# Patient Record
Sex: Male | Born: 1960 | Hispanic: Yes | Marital: Married | Smoking: Never smoker
Health system: Southern US, Community
[De-identification: ages and names within clinical notes are randomized; demographics above are authoritative.]

## PROBLEM LIST (undated history)

## (undated) DIAGNOSIS — I639 Cerebral infarction, unspecified: Secondary | ICD-10-CM

## (undated) DIAGNOSIS — E7800 Pure hypercholesterolemia, unspecified: Secondary | ICD-10-CM

## (undated) HISTORY — DX: Cerebral infarction, unspecified: I63.9

---

## 2009-09-15 ENCOUNTER — Emergency Department (HOSPITAL_COMMUNITY)
Admission: EM | Admit: 2009-09-15 | Discharge: 2009-09-15 | Payer: Self-pay | Source: Home / Self Care | Admitting: Emergency Medicine

## 2010-06-07 LAB — POCT I-STAT, CHEM 8
BUN: 17 mg/dL (ref 6–23)
Calcium, Ion: 1.13 mmol/L (ref 1.12–1.32)
Creatinine, Ser: 1.3 mg/dL (ref 0.4–1.5)
Potassium: 3.4 mEq/L — ABNORMAL LOW (ref 3.5–5.1)
Sodium: 139 mEq/L (ref 135–145)

## 2010-06-07 LAB — CBC
HCT: 40.2 % (ref 39.0–52.0)
MCH: 29.3 pg (ref 26.0–34.0)
MCV: 84.8 fL (ref 78.0–100.0)
Platelets: 352 10*3/uL (ref 150–400)
RBC: 4.74 MIL/uL (ref 4.22–5.81)

## 2010-06-07 LAB — DIFFERENTIAL
Basophils Relative: 0 % (ref 0–1)
Eosinophils Absolute: 0.2 10*3/uL (ref 0.0–0.7)
Eosinophils Relative: 2 % (ref 0–5)
Lymphocytes Relative: 20 % (ref 12–46)
Lymphs Abs: 1.7 10*3/uL (ref 0.7–4.0)
Neutro Abs: 5.6 10*3/uL (ref 1.7–7.7)

## 2016-05-01 ENCOUNTER — Encounter (HOSPITAL_COMMUNITY): Payer: Self-pay | Admitting: Nurse Practitioner

## 2016-05-01 ENCOUNTER — Emergency Department (HOSPITAL_COMMUNITY): Payer: Self-pay

## 2016-05-01 ENCOUNTER — Inpatient Hospital Stay (HOSPITAL_COMMUNITY)
Admission: EM | Admit: 2016-05-01 | Discharge: 2016-05-03 | DRG: 065 | Disposition: A | Discharge status: Home / Self Care | Payer: Self-pay | Attending: Family Medicine | Admitting: Family Medicine

## 2016-05-01 DIAGNOSIS — Z789 Other specified health status: Secondary | ICD-10-CM

## 2016-05-01 DIAGNOSIS — R4781 Slurred speech: Secondary | ICD-10-CM | POA: Diagnosis present

## 2016-05-01 DIAGNOSIS — I6350 Cerebral infarction due to unspecified occlusion or stenosis of unspecified cerebral artery: Secondary | ICD-10-CM

## 2016-05-01 DIAGNOSIS — I6302 Cerebral infarction due to thrombosis of basilar artery: Secondary | ICD-10-CM

## 2016-05-01 DIAGNOSIS — Z603 Acculturation difficulty: Secondary | ICD-10-CM

## 2016-05-01 DIAGNOSIS — R29810 Facial weakness: Secondary | ICD-10-CM | POA: Diagnosis present

## 2016-05-01 DIAGNOSIS — R739 Hyperglycemia, unspecified: Secondary | ICD-10-CM

## 2016-05-01 DIAGNOSIS — E785 Hyperlipidemia, unspecified: Secondary | ICD-10-CM | POA: Diagnosis present

## 2016-05-01 DIAGNOSIS — E781 Pure hyperglyceridemia: Secondary | ICD-10-CM | POA: Diagnosis present

## 2016-05-01 DIAGNOSIS — R2990 Unspecified symptoms and signs involving the nervous system: Secondary | ICD-10-CM

## 2016-05-01 DIAGNOSIS — R29702 NIHSS score 2: Secondary | ICD-10-CM | POA: Diagnosis present

## 2016-05-01 DIAGNOSIS — R531 Weakness: Secondary | ICD-10-CM

## 2016-05-01 DIAGNOSIS — I639 Cerebral infarction, unspecified: Principal | ICD-10-CM | POA: Diagnosis present

## 2016-05-01 DIAGNOSIS — R1310 Dysphagia, unspecified: Secondary | ICD-10-CM | POA: Diagnosis present

## 2016-05-01 DIAGNOSIS — G8191 Hemiplegia, unspecified affecting right dominant side: Secondary | ICD-10-CM | POA: Diagnosis present

## 2016-05-01 LAB — I-STAT CHEM 8, ED
BUN: 18 mg/dL (ref 6–20)
CALCIUM ION: 1.2 mmol/L (ref 1.15–1.40)
CHLORIDE: 106 mmol/L (ref 101–111)
Creatinine, Ser: 0.8 mg/dL (ref 0.61–1.24)
GLUCOSE: 105 mg/dL — AB (ref 65–99)
HCT: 43 % (ref 39.0–52.0)
Hemoglobin: 14.6 g/dL (ref 13.0–17.0)
Potassium: 3.5 mmol/L (ref 3.5–5.1)
Sodium: 141 mmol/L (ref 135–145)
TCO2: 25 mmol/L (ref 0–100)

## 2016-05-01 LAB — COMPREHENSIVE METABOLIC PANEL
ALBUMIN: 3.9 g/dL (ref 3.5–5.0)
ALK PHOS: 75 U/L (ref 38–126)
ALT: 19 U/L (ref 17–63)
AST: 23 U/L (ref 15–41)
Anion gap: 10 (ref 5–15)
BILIRUBIN TOTAL: 0.4 mg/dL (ref 0.3–1.2)
BUN: 15 mg/dL (ref 6–20)
CALCIUM: 9.6 mg/dL (ref 8.9–10.3)
CO2: 23 mmol/L (ref 22–32)
Chloride: 106 mmol/L (ref 101–111)
Creatinine, Ser: 0.9 mg/dL (ref 0.61–1.24)
GFR calc Af Amer: >60 mL/min (ref >60)
GFR calc non Af Amer: >60 mL/min (ref >60)
GLUCOSE: 106 mg/dL — AB (ref 65–99)
Potassium: 3.6 mmol/L (ref 3.5–5.1)
Sodium: 139 mmol/L (ref 135–145)
TOTAL PROTEIN: 6.5 g/dL (ref 6.5–8.1)

## 2016-05-01 LAB — CBC
HEMATOCRIT: 43.5 % (ref 39.0–52.0)
HEMOGLOBIN: 14.7 g/dL (ref 13.0–17.0)
MCH: 28.8 pg (ref 26.0–34.0)
MCHC: 33.8 g/dL (ref 30.0–36.0)
MCV: 85.3 fL (ref 78.0–100.0)
Platelets: 309 10*3/uL (ref 150–400)
RBC: 5.1 MIL/uL (ref 4.22–5.81)
RDW: 12.5 % (ref 11.5–15.5)
WBC: 7.5 10*3/uL (ref 4.0–10.5)

## 2016-05-01 LAB — DIFFERENTIAL
BASOS ABS: 0 10*3/uL (ref 0.0–0.1)
Basophils Relative: 0 %
EOS PCT: 3 %
Eosinophils Absolute: 0.2 10*3/uL (ref 0.0–0.7)
LYMPHS ABS: 1.9 10*3/uL (ref 0.7–4.0)
LYMPHS PCT: 25 %
Monocytes Absolute: 0.6 10*3/uL (ref 0.1–1.0)
Monocytes Relative: 8 %
NEUTROS PCT: 64 %
Neutro Abs: 4.8 10*3/uL (ref 1.7–7.7)

## 2016-05-01 LAB — APTT: aPTT: 32 seconds (ref 24–36)

## 2016-05-01 LAB — I-STAT TROPONIN, ED: Troponin i, poc: 0 ng/mL (ref 0.00–0.08)

## 2016-05-01 LAB — CBG MONITORING, ED: Glucose-Capillary: 119 mg/dL — ABNORMAL HIGH (ref 65–99)

## 2016-05-01 LAB — PROTIME-INR
INR: 0.98
Prothrombin Time: 12.9 seconds (ref 11.4–15.2)

## 2016-05-01 MED ORDER — ACETAMINOPHEN 325 MG PO TABS: 650.0000 mg | ORAL_TABLET | ORAL | Status: DC | PRN

## 2016-05-01 MED ORDER — SENNOSIDES-DOCUSATE SODIUM 8.6-50 MG PO TABS: 1.0000 | ORAL_TABLET | Freq: Every evening | ORAL | Status: DC | PRN

## 2016-05-01 MED ORDER — ACETAMINOPHEN 160 MG/5ML PO SOLN: 650.0000 mg | ORAL | Status: DC | PRN

## 2016-05-01 MED ORDER — STROKE: EARLY STAGES OF RECOVERY BOOK
Freq: Once | Status: AC
  Administered 2016-05-01: 23:00:00
  Filled 2016-05-01: qty 1

## 2016-05-01 MED ORDER — ACETAMINOPHEN 650 MG RE SUPP: 650.0000 mg | RECTAL | Status: DC | PRN

## 2016-05-01 MED ORDER — SODIUM CHLORIDE 0.9 % IV SOLN
INTRAVENOUS | Status: DC
  Administered 2016-05-01 – 2016-05-02 (×2): via INTRAVENOUS

## 2016-05-01 MED ORDER — ENOXAPARIN SODIUM 40 MG/0.4ML ~~LOC~~ SOLN
40.0000 mg | SUBCUTANEOUS | Status: DC
  Administered 2016-05-01 – 2016-05-02 (×2): 40 mg via SUBCUTANEOUS
  Filled 2016-05-01 (×2): qty 0.4

## 2016-05-01 NOTE — ED Triage Notes (Addendum)
The patient presents with c/o stroke symptoms. The symptoms began 2 days ago. He reports dizziness, slurred speech, R facial droop, numbness to the R side of his body. He denies LOC, confusion, pain. The symptoms have been getting worse since onset. He denies any history fo stroke.

## 2016-05-01 NOTE — ED Provider Notes (Signed)
[facility]-EMERGENCY DEPT Provider Note   CSN: [id number] Arrival date & time: 05/01/16  1616     History   Chief Complaint Chief Complaint  Patient presents with  . Stroke Symptoms    HPI Steve Callahan is a 56 y.o. male.  Patient is a 56 year old Hispanic male with no significant past medical history who presents with strokelike symptoms. The symptoms started yesterday afternoon. It's been persistent since that time. He reports slurred speech in association with some facial drooping and right side numbness and weakness involving both his upper and lower extremity. He also has some dizziness on ambulation and feels off balance. He denies any chest pain or shortness of breath. No other recent illnesses. No prior history of strokes.      History reviewed. No pertinent past medical history.  Patient Active Problem List   Diagnosis Date Noted  . 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 Stroke [facility]) 05/01/2016    History reviewed. No pertinent surgical history.     Home Medications    Prior to Admission medications   Not on File    Family History History reviewed. No pertinent family history.  Social History Social History  Substance Use Topics  . Smoking status: Never Smoker  . Smokeless tobacco: Never Used  . Alcohol use No     Allergies   Patient has no known allergies.   Review of Systems Review of Systems  Constitutional: Negative for chills, diaphoresis, fatigue and fever.  HENT: Negative for congestion, rhinorrhea and sneezing.   Eyes: Negative.   Respiratory: Negative for cough, chest tightness and shortness of breath.   Cardiovascular: Negative for chest pain and leg swelling.  Gastrointestinal: Negative for abdominal pain, blood in stool, diarrhea, nausea and vomiting.  Genitourinary: Negative for difficulty urinating, flank pain, frequency and hematuria.  Musculoskeletal: Negative for arthralgias and back pain.  Skin: Negative for rash.  Neurological:  Positive for dizziness, speech difficulty, weakness, numbness and headaches.     Physical Exam Updated Vital Signs BP 126/77 (BP Location: Right Arm)   Pulse (!) 58   Temp 97.7 F (36.5 C) (Oral)   Resp 16   Ht 5\' 6"  (1.676 m)   Wt 178 lb 2.1 oz (80.8 kg)   SpO2 100%   BMI 28.75 kg/m   Physical Exam  Constitutional: He is oriented to person, place, and time. He appears well-developed and well-nourished.  HENT:  Head: Normocephalic and atraumatic.  Eyes: Pupils are equal, round, and reactive to light.  Neck: Normal range of motion. Neck supple.  Cardiovascular: Normal rate, regular rhythm and normal heart sounds.   Pulmonary/Chest: Effort normal and breath sounds normal. No respiratory distress. He has no wheezes. He has no rales. He exhibits no tenderness.  Abdominal: Soft. Bowel sounds are normal. There is no tenderness. There is no rebound and no guarding.  Musculoskeletal: Normal range of motion. He exhibits no edema.  Lymphadenopathy:    He has no cervical adenopathy.  Neurological: He is alert and oriented to person, place, and time.  Patient has some slight facial drooping. He has a slight right arm drift. No obvious drift in the right leg. He has some diminished sensation to light touch in the upper and lower extremity on the right as well as the right side of the face.  Skin: Skin is warm and dry. No rash noted.  Psychiatric: He has a normal mood and affect.     ED Treatments / Results  Labs (all labs ordered are  listed, but only abnormal results are displayed) Labs Reviewed  COMPREHENSIVE METABOLIC PANEL - Abnormal; Notable for the following:       Result Value   Glucose, Bld 106 (*)    All other components within normal limits  CBG MONITORING, ED - Abnormal; Notable for the following:    Glucose-Capillary 119 (*)    All other components within normal limits  I-STAT CHEM 8, ED - Abnormal; Notable for the following:    Glucose, Bld 105 (*)    All other  components within normal limits  PROTIME-INR  APTT  CBC  DIFFERENTIAL  HEMOGLOBIN A1C  LIPID PANEL  TSH  I-STAT TROPOININ, ED    EKG  EKG Interpretation  Date/Time:  Sunday May 01 2016 16:59:42 EST Ventricular Rate:  66 PR Interval:  176 QRS Duration: 82 QT Interval:  372 QTC Calculation: 389 R Axis:   9 Text Interpretation:  Normal sinus rhythm Normal ECG No old tracing to compare Confirmed by Bartlomiej Jenkinson  MD, [place] Zou ([id number]) on 05/01/2016 6:34:06 PM       Radiology Ct Head Wo Contrast  Result Date: 05/01/2016 CLINICAL DATA:  Right arm weakness and slurred speech. Facial droop. EXAM: CT HEAD WITHOUT CONTRAST TECHNIQUE: Contiguous axial images were obtained from the base of the skull through the vertex without intravenous contrast. COMPARISON:  None. FINDINGS: Brain: No evidence of acute infarction, hemorrhage, hydrocephalus, extra-axial collection or mass lesion/mass effect. Vascular: No hyperdense vessel or unexpected calcification. Skull: Normal. Negative for fracture or focal lesion. Sinuses/Orbits: No acute finding. Other: None. IMPRESSION: No acute intracranial abnormalities. Electronically Signed   By: Gerome Samavid  Williams III M.D   On: 05/01/2016 18:25    Procedures Procedures (including critical care time)  Medications Ordered in ED Medications  0.9 %  sodium chloride infusion ( Intravenous New Bag/Given 05/01/16 2235)  acetaminophen (TYLENOL) tablet 650 mg (not administered)    Or  acetaminophen (TYLENOL) solution 650 mg (not administered)    Or  acetaminophen (TYLENOL) suppository 650 mg (not administered)  senna-docusate (Senokot-S) tablet 1 tablet (not administered)  enoxaparin (LOVENOX) injection 40 mg (40 mg Subcutaneous Given 05/01/16 2235)   stroke: mapping our early stages of recovery book ( Does not apply Given 05/01/16 2255)     Initial Impression / Assessment and Plan / ED Course  I have reviewed the triage vital signs and the nursing notes.  Pertinent  labs & imaging results that were available during my care of the patient were reviewed by me and considered in my medical decision making (see chart for details).     Patient presents with stroke symptoms that began yesterday afternoon. He is out of the window for TPA. I did consult Dr. Lillia AbedLindsay and will see the patient. This is with neurology. I also consulted unassigned medicine. Patient admitted to the family medicine service. I spoke with the resident who is accepted the patient for Dr. Georgeann OppenheimMcDiarmod  Final Clinical Impressions(s) / ED Diagnoses   Final diagnoses:  Stroke-like symptoms    New Prescriptions There are no discharge medications for this patient.    Rolan BuccoMelanie Fadil Macmaster, MD 05/01/16 [phone]

## 2016-05-01 NOTE — ED Notes (Signed)
Pt able to ambulate to the bathroom well, normal gait.

## 2016-05-01 NOTE — H&P (Signed)
Family Medicine Teaching [facility] Admission History and Physical Service Pager: [phone]  Patient name: Steve Callahan Medical record number: [id number] Date of birth: [birth year] Age: 56 y.o. Gender: male  Primary Care Provider: No primary care provider on file. Consultants: Neurology Code Status: Full (confirmed on admission)  Chief Complaint: R-sided weakness  Assessment and Plan: Steve Callahan is a 56 y.o. male presenting with R sided weakness and facial droop, concerning for stroke. PMH is significant for none. Spanish speaking.   R sided arm weakness and facial droop: CT head negative for acute infarct and hemorrhage, but symptoms most consistent with stroke. EKG showed NSR. I-stat troponin negative. Normotensive on admission, no history of smoking, but glucose mildly elevated (119) and patient slightly overweight. No headache to suggest complex migraine. NIH Stroke Scale/Score 2 for minor paralysis and dysarthria.  - Admit for stroke workup - Neurology consulting -- appreciate recommendations - Obtain risk stratification labs -- A1c, lipid panel, TSH - ECHO ordered - MRI/MRA brain w/o contrast and carotid U/S, according to Neurology - Anticoagulation per Neurology - Frequent Neuro checks - NPO until cleared with swallow eval - PT/OT consults  Access to Medical Care: - Inquire further about barriers to medical care - Consult SW/CM as appropriate  FEN/GI: NPO, NS@100cc /hr Prophylaxis: Lovenox  Disposition: Pending Stroke Work-up and PT eval  History of Present Illness:  Steve Callahan is a 56 y.o. male presenting with new onset R sided weakness that began day prior to admission. He reports he noticed he could not speak as well as usual or use his right arm as well suddenly while having a conversation. His wife and children note that his speech makes sense but is a little slurred. They say he also has had some drooling. He reports trouble swallowing. He denies numbness, changes in  vision, headaches. He has never smoked. He has never been told he has high blood pressure, though he does not regularly see a doctor. He has not had chest pain. He sought medical treatment because symptoms have stayed the same, have not improved.   Review Of Systems: Per HPI with the following additions:   Review of Systems  Constitutional: Negative for chills, fever and weight loss.  HENT: Negative for congestion, ear pain, hearing loss and tinnitus.   Eyes: Negative for double vision and pain.  Respiratory: Negative for cough and shortness of breath.   Cardiovascular: Negative for chest pain and palpitations.  Gastrointestinal: Negative for abdominal pain, nausea and vomiting.  Genitourinary: Negative for dysuria and frequency.  Musculoskeletal: Negative for myalgias and neck pain.  Skin: Negative for itching and rash.  Neurological: Positive for focal weakness and weakness. Negative for dizziness, loss of consciousness and headaches.  Psychiatric/Behavioral: Negative for memory loss and substance abuse.    Patient Active Problem List   Diagnosis Date Noted  . 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 No pertinent past medical history.  Never has been hospitalized or on regular medications.  Past Surgical History: History reviewed. No pertinent surgical history. Never has had surgery.   Social History: Social History  Substance Use Topics  . Smoking status: Never Smoker  . Smokeless tobacco: Never Used  . Alcohol use No   Additional social history: Lives with wife and daughter and son. Works as [employer] officer. Has never used illegal drugs.  Please also refer to relevant sections of EMR.  Family History: History reviewed. No pertinent family history. Sister has asthma.   Allergies and  Medications: No Known Allergies No current facility-administered medications on file prior to encounter.    No current outpatient prescriptions on file  prior to encounter.    Objective: BP 118/81   Pulse (!) 59   Temp 99.4 F (37.4 C) (Oral)   Resp 19   SpO2 97%  Body mass index is 28.75 kg/m. Exam: General: Alert, well-developed male in NAD Eyes: PERRLA, EOMI ENTM: Oropharynx normal, MMM Neck: Supple, FROM Cardiovascular: RRR, S1, S2, no m/r/g Respiratory: CTAB, no increased WOB Gastrointestinal: +BS, soft, NT, ND MSK: 5-/5 strength of RUE compared to LUE and same with grip strength. BLEs 5/5. Normal tone.  Derm: Scar noted of R forearm. No rashes noted on exposed skin.  Neuro: CNII-XII intact except for CNVII with R lop-sided smile. Finger-nose-finger normal bilaterally. Visual acuity 20/40 on the R and 20/30 on the left (though denies vision changes and says he sometimes has to wear glasses). Negative Romberg test. Gait normal.  Psych: Normal mood and affect.   Labs and Imaging: CBC BMET   Recent Labs Lab 05/01/16 1728 05/01/16 1750  WBC 7.5  --   HGB 14.7 14.6  HCT 43.5 43.0  PLT 309  --     Recent Labs Lab 05/01/16 1728 05/01/16 1750  NA 139 141  K 3.6 3.5  CL 106 106  CO2 23  --   BUN 15 18  CREATININE 0.90 0.80  GLUCOSE 106* 105*  CALCIUM 9.6  --      Ct Head Wo Contrast  Result Date: 05/01/2016 CLINICAL DATA:  Right arm weakness and slurred speech. Facial droop. EXAM: CT HEAD WITHOUT CONTRAST TECHNIQUE: Contiguous axial images were obtained from the base of the skull through the vertex without intravenous contrast. COMPARISON:  None. FINDINGS: Brain: No evidence of acute infarction, hemorrhage, hydrocephalus, extra-axial collection or mass lesion/mass effect. Vascular: No hyperdense vessel or unexpected calcification. Skull: Normal. Negative for fracture or focal lesion. Sinuses/Orbits: No acute finding. Other: None. IMPRESSION: No acute intracranial abnormalities. Electronically Signed   By: Gerome Samavid  Williams III M.D   On: 05/01/2016 18:25   Antonella Upson Percell BostonMoen Dusty Raczkowski, MD 05/01/2016, 8:45 PM PGY-2, [facility] FPTS Intern pager: [phone], text pages welcome

## 2016-05-01 NOTE — Progress Notes (Signed)
Patient admitted to room 5M11 via stretcher accompanied by family members. Patient speaks very little english family at bedside . Oriented to room and call light system made comfortable Telemetry initiated and verified.

## 2016-05-01 NOTE — Consult Note (Signed)
Referring Physician: Dr. McDiarmid    Chief Complaint: Right face and arm weakness/numbness  HPI: Steve Callahan is an 56 y.o. male who presented to the ED with 2 day history of dizziness, slurred speech, right facial droop and numbness along the right side of his body. Denies confusion or difficulty comprehending speech. His sentences have had normal grammar (he speaks Spanish), but with slurring. He has no prior history of stroke. Onset of the symptoms was sudden.   History reviewed. No pertinent past medical history.  History reviewed. No pertinent surgical history.  History reviewed. No pertinent family history. Social History:  reports that he has never smoked. He has never used smokeless tobacco. He reports that he does not drink alcohol or use drugs.  Allergies: No Known Allergies  Medications:  Current Facility-Administered Medications:  .  0.9 %  sodium chloride infusion, , Intravenous, Continuous, Rogue Bussing, MD, Last Rate: 100 mL/hr at 05/01/16 2235 .  acetaminophen (TYLENOL) tablet 650 mg, 650 mg, Oral, Q4H PRN **OR** acetaminophen (TYLENOL) solution 650 mg, 650 mg, Per Tube, Q4H PRN **OR** acetaminophen (TYLENOL) suppository 650 mg, 650 mg, Rectal, Q4H PRN, Rogue Bussing, MD .  enoxaparin (LOVENOX) injection 40 mg, 40 mg, Subcutaneous, Q24H, Rogue Bussing, MD, 40 mg at 05/01/16 2235 .  senna-docusate (Senokot-S) tablet 1 tablet, 1 tablet, Oral, QHS PRN, Rogue Bussing, MD  ROS: No headache, confusion, fever, chills, chest pain, abdominal pain, vision loss or limb pain. Other ROS as per HPI.   Physical Examination: Blood pressure 118/81, pulse (!) 59, temperature 99.4 F (37.4 C), temperature source Oral, resp. rate 19, SpO2 97 %.  HEENT: [place]/AT Lungs: No gross wheezing. Respirations unlabored.  Ext: No edema.   Neurologic Examination: Ment: Alert and fully oriented. Speech fluent in [place] per family member, who interprets. Intact  comprehension for all questions and commands. Naming intact.  CN: Visual fields intact. PERRL. EOMI without nystagmus. Decreased temperature sensation right face. Right facial droop. Hearing intact to questions and commands. No hypophonia. Tongue protrudes midline.  Motor: 4+/5 RUE and RLE. 5/5 LUE and LLE. No drift.  Sensory: Normal temperature and FT sensation in all 4 extremities. No extinction. Reflexes: 3+ right brachioradialis, 2+ right biceps. 3+ left biceps and brachioradialis. 3+ right patella, 4+ left patella. 2+ right achilles, 1+ left achilles. Toes downgoing bilaterally.  Cerebellar: No ataxia with FNF bilaterally.  Gait: Deferred.     NIHSS: 2 for minor facial weakness and dysarthria  Results for orders placed or performed during the hospital encounter of 05/01/16 (from the past 48 hour(s))  Protime-INR     Status: None   Collection Time: 05/01/16  5:28 PM  Result Value Ref Range   Prothrombin Time 12.9 11.4 - 15.2 seconds   INR 0.98   APTT     Status: None   Collection Time: 05/01/16  5:28 PM  Result Value Ref Range   aPTT 32 24 - 36 seconds  CBC     Status: None   Collection Time: 05/01/16  5:28 PM  Result Value Ref Range   WBC 7.5 4.0 - 10.5 K/uL   RBC 5.10 4.22 - 5.81 MIL/uL   Hemoglobin 14.7 13.0 - 17.0 g/dL   HCT 43.5 39.0 - 52.0 %   MCV 85.3 78.0 - 100.0 fL   MCH 28.8 26.0 - 34.0 pg   MCHC 33.8 30.0 - 36.0 g/dL   RDW 12.5 11.5 - 15.5 %   Platelets 309 150 - 400 K/uL  Differential     Status: None   Collection Time: 05/01/16  5:28 PM  Result Value Ref Range   Neutrophils Relative % 64 %   Neutro Abs 4.8 1.7 - 7.7 K/uL   Lymphocytes Relative 25 %   Lymphs Abs 1.9 0.7 - 4.0 K/uL   Monocytes Relative 8 %   Monocytes Absolute 0.6 0.1 - 1.0 K/uL   Eosinophils Relative 3 %   Eosinophils Absolute 0.2 0.0 - 0.7 K/uL   Basophils Relative 0 %   Basophils Absolute 0.0 0.0 - 0.1 K/uL  Comprehensive metabolic panel     Status: Abnormal   Collection Time: 05/01/16   5:28 PM  Result Value Ref Range   Sodium 139 135 - 145 mmol/L   Potassium 3.6 3.5 - 5.1 mmol/L   Chloride 106 101 - 111 mmol/L   CO2 23 22 - 32 mmol/L   Glucose, Bld 106 (H) 65 - 99 mg/dL   BUN 15 6 - 20 mg/dL   Creatinine, Ser 0.90 0.61 - 1.24 mg/dL   Calcium 9.6 8.9 - 10.3 mg/dL   Total Protein 6.5 6.5 - 8.1 g/dL   Albumin 3.9 3.5 - 5.0 g/dL   AST 23 15 - 41 U/L   ALT 19 17 - 63 U/L   Alkaline Phosphatase 75 38 - 126 U/L   Total Bilirubin 0.4 0.3 - 1.2 mg/dL   GFR calc non Af Amer >60 >60 mL/min   GFR calc Af Amer >60 >60 mL/min    Comment: (NOTE) The eGFR has been calculated using the CKD EPI equation. This calculation has not been validated in all clinical situations. eGFR's persistently <60 mL/min signify possible Chronic Kidney Disease.    Anion gap 10 5 - 15  I-stat troponin, ED     Status: None   Collection Time: 05/01/16  5:48 PM  Result Value Ref Range   Troponin i, poc 0.00 0.00 - 0.08 ng/mL   Comment 3            Comment: Due to the release kinetics of cTnI, a negative result within the first hours of the onset of symptoms does not rule out myocardial infarction with certainty. If myocardial infarction is still suspected, repeat the test at appropriate intervals.   I-Stat Chem 8, ED     Status: Abnormal   Collection Time: 05/01/16  5:50 PM  Result Value Ref Range   Sodium 141 135 - 145 mmol/L   Potassium 3.5 3.5 - 5.1 mmol/L   Chloride 106 101 - 111 mmol/L   BUN 18 6 - 20 mg/dL   Creatinine, Ser 0.80 0.61 - 1.24 mg/dL   Glucose, Bld 105 (H) 65 - 99 mg/dL   Calcium, Ion 1.20 1.15 - 1.40 mmol/L   TCO2 25 0 - 100 mmol/L   Hemoglobin 14.6 13.0 - 17.0 g/dL   HCT 43.0 39.0 - 52.0 %  CBG monitoring, ED     Status: Abnormal   Collection Time: 05/01/16  6:40 PM  Result Value Ref Range   Glucose-Capillary 119 (H) 65 - 99 mg/dL   Ct Head Wo Contrast  Result Date: 05/01/2016 CLINICAL DATA:  Right arm weakness and slurred speech. Facial droop. EXAM: CT HEAD  WITHOUT CONTRAST TECHNIQUE: Contiguous axial images were obtained from the base of the skull through the vertex without intravenous contrast. COMPARISON:  None. FINDINGS: Brain: No evidence of acute infarction, hemorrhage, hydrocephalus, extra-axial collection or mass lesion/mass effect. Vascular: No hyperdense vessel or unexpected calcification. Skull:  Normal. Negative for fracture or focal lesion. Sinuses/Orbits: No acute finding. Other: None. IMPRESSION: No acute intracranial abnormalities. Electronically Signed   By: Dorise Bullion III M.D   On: 05/01/2016 18:25    Assessment: 56 y.o. male with 2 day history of right facial droop and right sided numbness.  1. CT head read as normal by Radiology. On my review of the images, there appears to be a medial left pontine wedge-shaped hypodensity appearing most consistent with a subacute ischemic infarction.  2. Stroke Risk Factors - Unknown; the patient has no prior medical diagnoses  Plan: 1. HgbA1c, fasting lipid panel 2. MRI, MRA  of the brain without contrast 3. PT consult, OT consult, Speech consult 4. Echocardiogram 5. Carotid dopplers 6. ASA 81 mg po qd 7. Atorvastatin 40 mg po qd. Check baseline CK level.  8. Telemetry monitoring 9. Frequent neuro checks 10. BP management. Out of permissive HTN time window.     @Electronically  signed: Dr. Kerney Elbe  05/01/2016, 10:05 PM

## 2016-05-02 ENCOUNTER — Encounter (HOSPITAL_COMMUNITY): Payer: Self-pay | Admitting: Radiology

## 2016-05-02 ENCOUNTER — Inpatient Hospital Stay (HOSPITAL_COMMUNITY): Payer: Self-pay

## 2016-05-02 DIAGNOSIS — I639 Cerebral infarction, unspecified: Secondary | ICD-10-CM

## 2016-05-02 DIAGNOSIS — R2990 Unspecified symptoms and signs involving the nervous system: Secondary | ICD-10-CM

## 2016-05-02 DIAGNOSIS — R531 Weakness: Secondary | ICD-10-CM

## 2016-05-02 DIAGNOSIS — R739 Hyperglycemia, unspecified: Secondary | ICD-10-CM

## 2016-05-02 DIAGNOSIS — I6789 Other cerebrovascular disease: Secondary | ICD-10-CM

## 2016-05-02 DIAGNOSIS — Z789 Other specified health status: Secondary | ICD-10-CM

## 2016-05-02 DIAGNOSIS — E785 Hyperlipidemia, unspecified: Secondary | ICD-10-CM

## 2016-05-02 DIAGNOSIS — I6302 Cerebral infarction due to thrombosis of basilar artery: Secondary | ICD-10-CM

## 2016-05-02 LAB — LIPID PANEL
CHOLESTEROL: 161 mg/dL (ref 0–200)
HDL: 37 mg/dL — ABNORMAL LOW (ref >40)
LDL CALC: 92 mg/dL (ref 0–99)
TRIGLYCERIDES: 159 mg/dL — AB (ref <150)
Total CHOL/HDL Ratio: 4.4 RATIO
VLDL: 32 mg/dL (ref 0–40)

## 2016-05-02 LAB — RAPID URINE DRUG SCREEN, HOSP PERFORMED
Amphetamines: NOT DETECTED
BARBITURATES: NOT DETECTED
Benzodiazepines: NOT DETECTED
Cocaine: NOT DETECTED
Opiates: NOT DETECTED
TETRAHYDROCANNABINOL: NOT DETECTED

## 2016-05-02 LAB — ECHOCARDIOGRAM COMPLETE
HEIGHTINCHES: 66 in
Weight: 2850.11 oz

## 2016-05-02 LAB — TSH: TSH: 2.363 u[IU]/mL (ref 0.350–4.500)

## 2016-05-02 LAB — CK: Total CK: 103 U/L (ref 49–397)

## 2016-05-02 IMAGING — MR MR HEAD W/O CM
9 of 10 series · 36 of 48 positions shown · non-contrast
Comparison: CT [DATE]

CLINICAL DATA: Stroke. Two day history of dizziness and slurred
speech and right facial droop. Right body numbness.

EXAM:
MRI HEAD WITHOUT CONTRAST
TECHNIQUE: Multiplanar, multiecho pulse sequences of the brain and surrounding
structures were obtained without intravenous contrast.

[Series 3: T1 · sagittal · 5.0mm · 0.47mm/px · 2 of 24 slices shown]
[im 1/24]
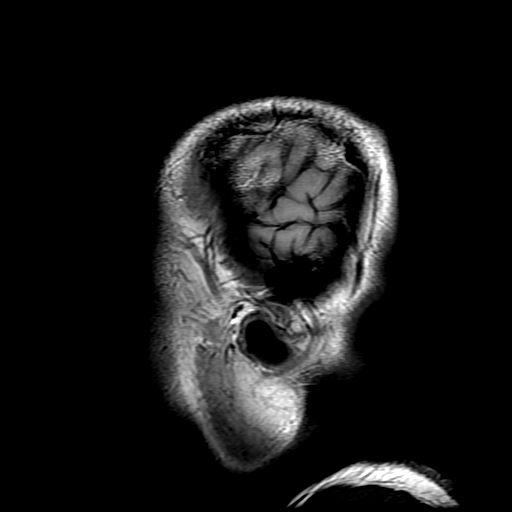
[im 24/24]
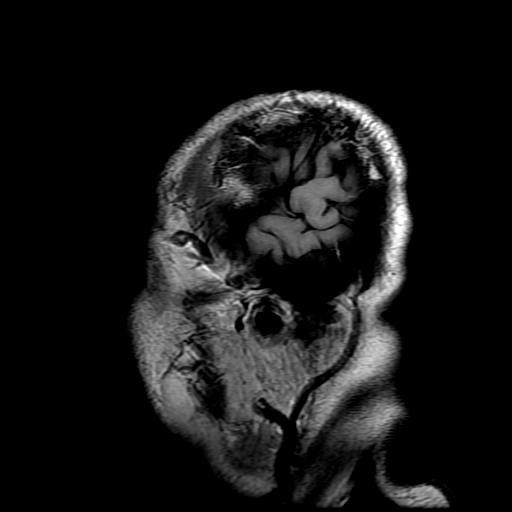

[Series 4: DWI · axial · 3.0mm · 1.09mm/px · z∈[-16,+115]mm · 9 of 90 slices shown (1 of 4)]
[im 1/90]
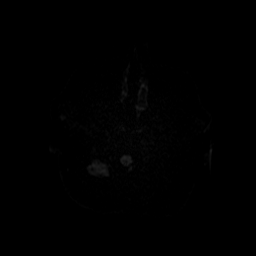
[im 12/90]
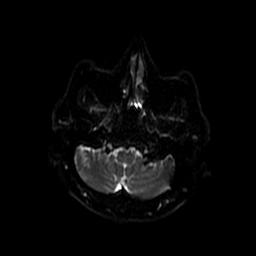
[im 23/90]
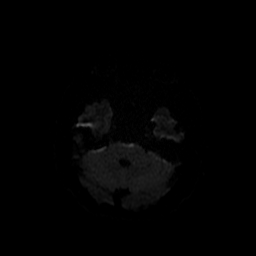
[im 34/90]
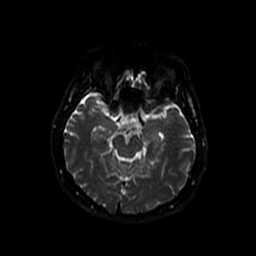
[im 45/90]
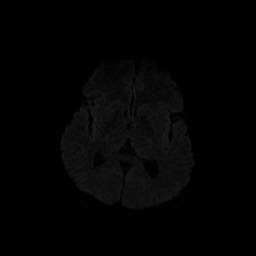
[im 56/90]
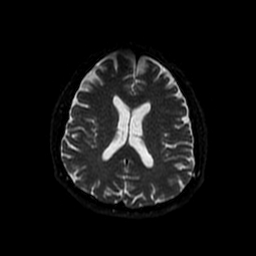
[im 67/90]
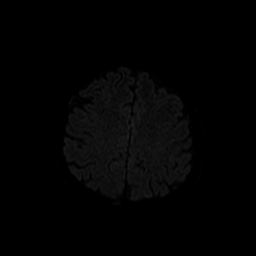
[im 78/90]
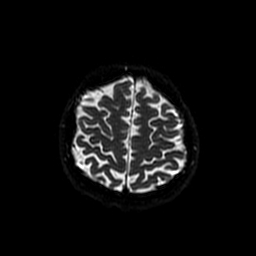
[im 90/90]
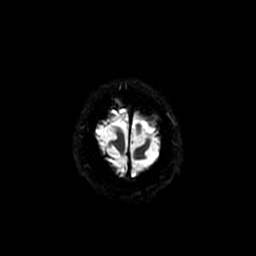

[Series 5: T2 · axial · 5.0mm · 0.43mm/px · z∈[-24,+120]mm · 3 of 25 slices shown]
[im 1/25]
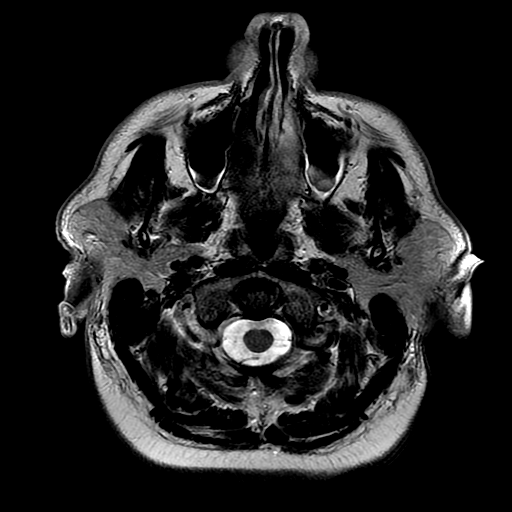
[im 13/25]
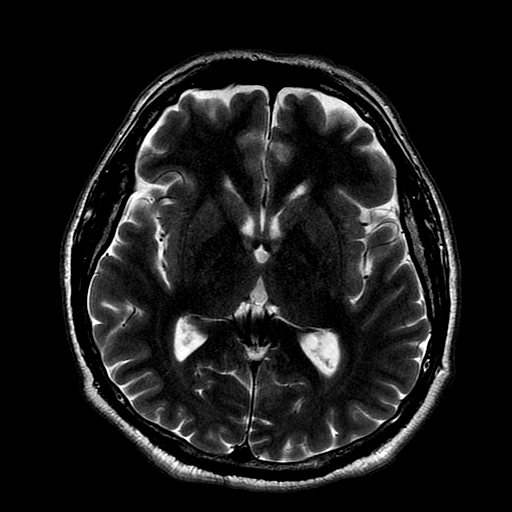
[im 25/25]
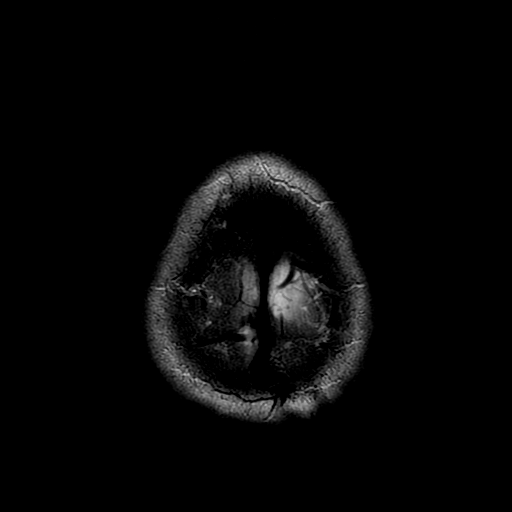

[Series 6: DWI · coronal · 5.0mm · 1.09mm/px · 7 of 66 slices shown (2 of 4)]
[im 1/66]
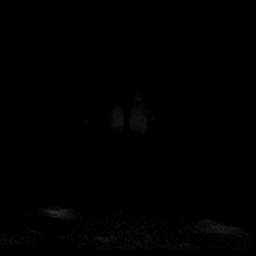
[im 11/66]
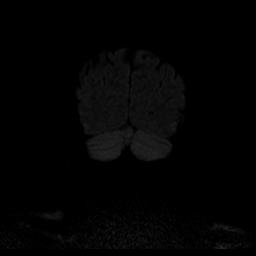
[im 22/66]
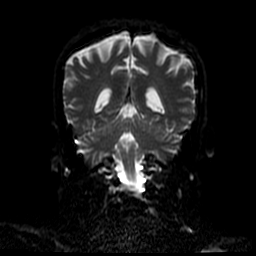
[im 33/66]
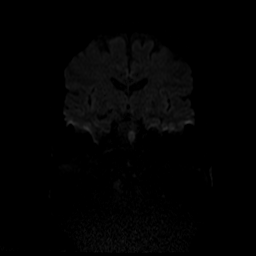
[im 44/66]
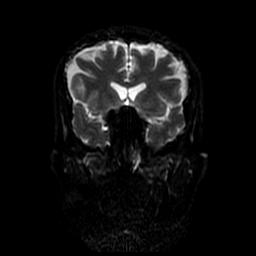
[im 55/66]
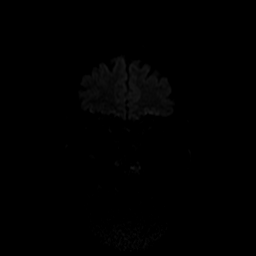
[im 66/66]
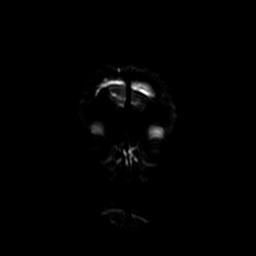

[Series 7: FLAIR · axial · 5.0mm · 0.43mm/px · z∈[-24,+120]mm · 3 of 25 slices shown]
[im 1/25]
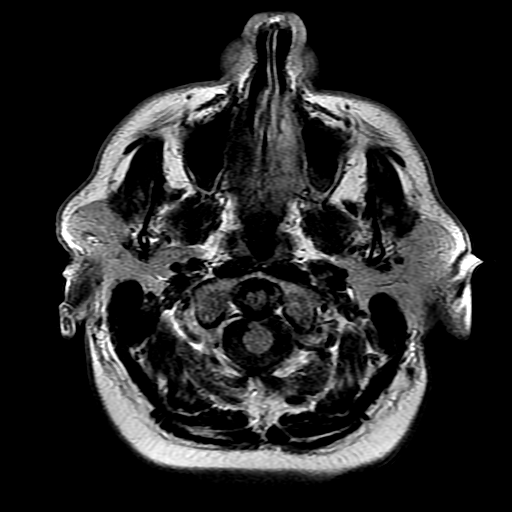
[im 13/25]
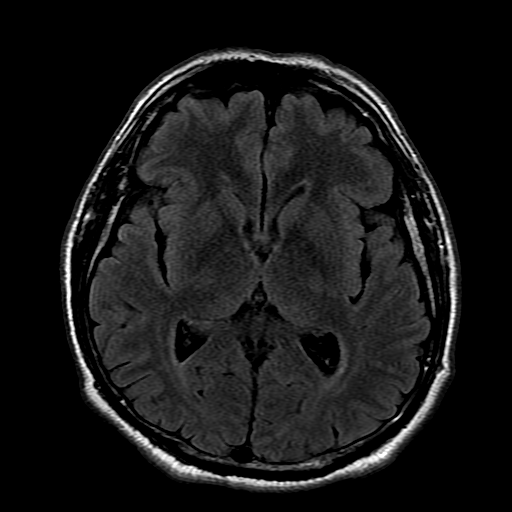
[im 25/25]
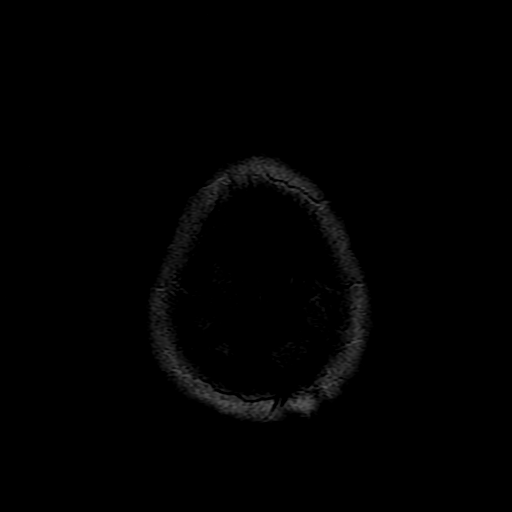

[Series 8: ax mpgr · axial · 5.0mm · 0.43mm/px · 1 of 21 slices shown]
[im 1/21]
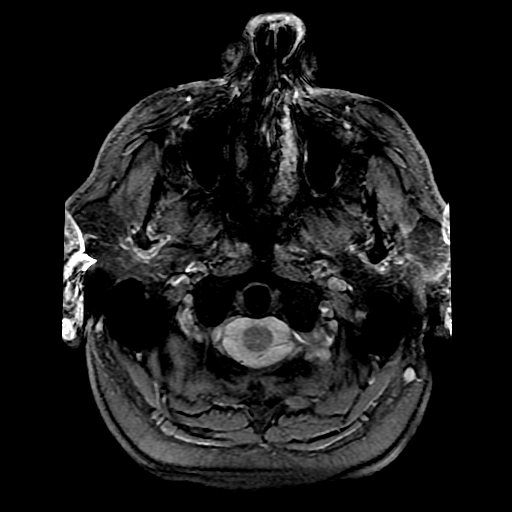

[Series 10: T2 post-contrast · coronal · 5.0mm · 0.39mm/px · 3 of 25 slices shown]
[im 1/25]
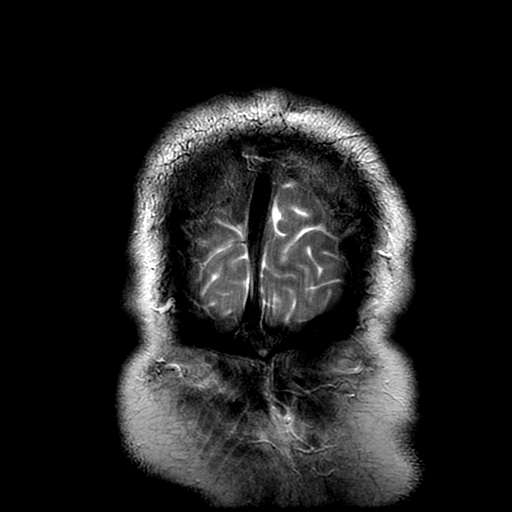
[im 13/25]
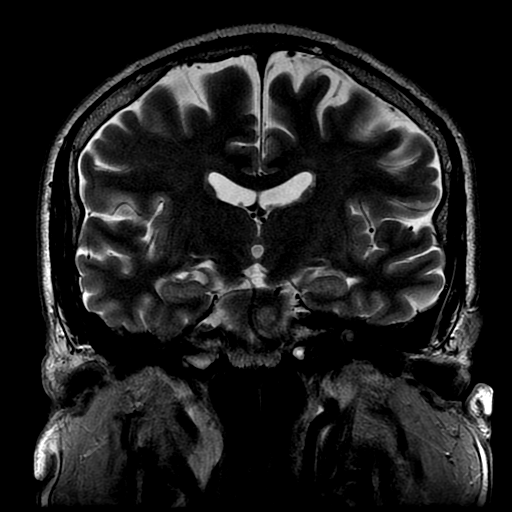
[im 25/25]
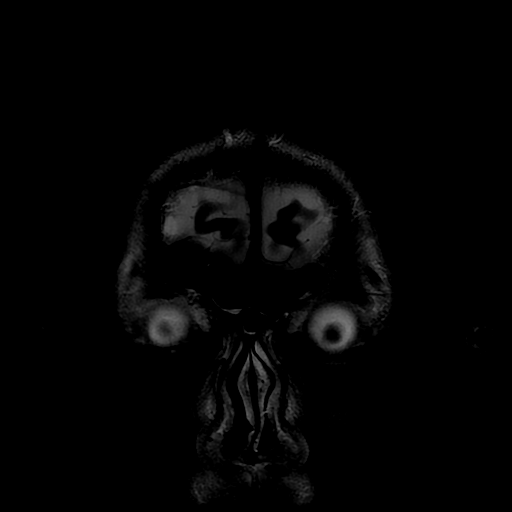

[Series 400: DWI · axial · 3.0mm · 1.09mm/px · z∈[-16,+115]mm · 5 of 45 slices shown (3 of 4)]
[im 1/45]
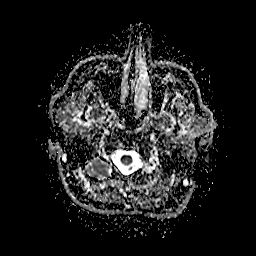
[im 12/45]
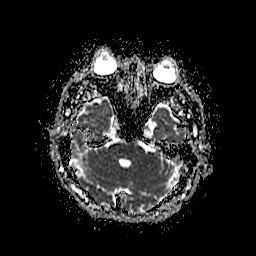
[im 23/45]
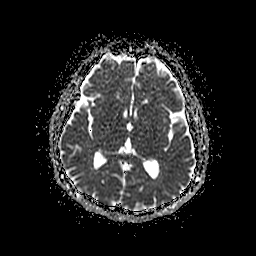
[im 34/45]
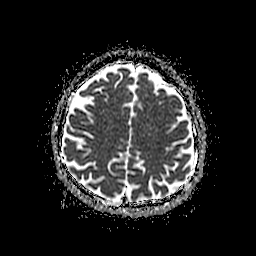
[im 45/45]
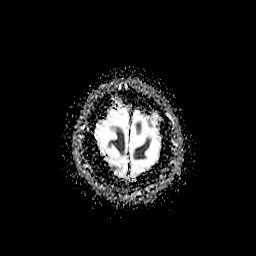

[Series 600: DWI · coronal · 5.0mm · 1.09mm/px · 3 of 33 slices shown (4 of 4)]
[im 1/33]
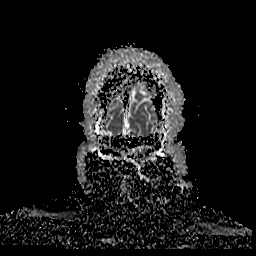
[im 17/33]
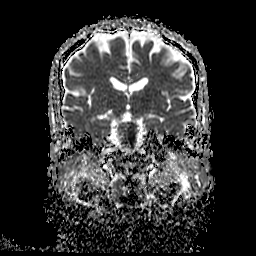
[im 33/33]
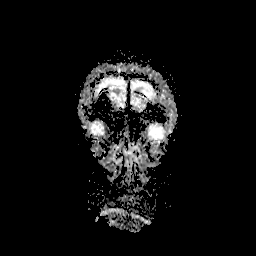

[36 of 48 positions shown; findings below may reference images not displayed]

FINDINGS: Brain: Acute infarct left paramedian pons measuring approximately 10
x 20 mm. This shows restricted diffusion. No other acute infarct is
identified

Ventricle size normal. No significant chronic ischemia. Negative for
hemorrhage or mass.

Vascular: Normal arterial flow void. Small basilar artery due to
hypoplastic basilar. Fetal origin left posterior cerebral artery.

Skull and upper cervical spine: Negative

Sinuses/Orbits: Mild mucosal edema paranasal sinuses. No orbital
lesion.

Other: None
IMPRESSION: 10 x 20 mm acute infarct left paramedian pons. No significant
chronic ischemia.

Mild mucosal edema paranasal sinuses.

## 2016-05-02 MED ORDER — ASPIRIN EC 81 MG PO TBEC
81.0000 mg | DELAYED_RELEASE_TABLET | Freq: Every day | ORAL | Status: DC
  Administered 2016-05-02 – 2016-05-03 (×2): 81 mg via ORAL
  Filled 2016-05-02 (×2): qty 1

## 2016-05-02 MED ORDER — ATORVASTATIN CALCIUM 40 MG PO TABS
40.0000 mg | ORAL_TABLET | Freq: Every day | ORAL | Status: DC
  Administered 2016-05-02: 40 mg via ORAL
  Filled 2016-05-02: qty 1

## 2016-05-02 MED ORDER — IOPAMIDOL (ISOVUE-370) INJECTION 76%
INTRAVENOUS | Status: AC
  Administered 2016-05-02: 50 mL
  Filled 2016-05-02: qty 50

## 2016-05-02 NOTE — Progress Notes (Signed)
  Echocardiogram 2D Echocardiogram has been performed.  Nolon RodBrown, Tony 05/02/2016, 2:28 PM

## 2016-05-02 NOTE — Care Management Note (Signed)
Case Management Note  Patient Details  Name: Steve Callahan MRN: [id number] Date of Birth: 10/06/1960  Subjective/Objective:                 Patient presented with right-sided weakness.  Lives at home with spouse. Patient is Spanish speaking and currently listed as self-pay. CM will follow for discharge needs pending therapy evals and physician orders.    Action/Plan:   Expected Discharge Date:                  Expected Discharge Plan:     In-House Referral:     Discharge planning Services     Post Acute Care Choice:    Choice offered to:     DME Arranged:    DME Agency:     HH Arranged:    HH Agency:     Status of Service:     If discussed at [employer] Length of Stay Meetings, dates discussed:    Additional Comments:  Anda KraftRobarge, Gustaf Mccarter C, RN 05/02/2016, 9:27 AM

## 2016-05-02 NOTE — Progress Notes (Signed)
Family Medicine Teaching Service Daily Progress Note Intern Pager: [phone]  Patient name: Steve Callahan Medical record number: [id number] Date of birth: 12/17/1960 Age: 56 y.o. Gender: male  Primary Care Provider: No primary care provider on file. Consultants: neuro Code Status: full  Pt Overview and Major Events to Date:  2/11 - admit for Stroke-like symptoms  Assessment and Plan: Steve ReeJuan Bramble is a 56 y.o. male presenting with R sided weakness and facial droop, concerning for stroke. PMH is significant for none. Spanish speaking.   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 No large vessel occlusion or proximal stenosis on CTA head/neck - Neurology consulting -- appreciate recommendations - risk stratification labs -- A1c pending, lipid panel - LDL 92, TSH wnl - ECHO pending - ASA 81mg  daily - monitor on tele - Frequent Neuro checks - PT/OT consults - SLP eval as patient reports some difficulty swallowing - start statin for ASCVDsecondary prevention, check baseline CK first  Access to Medical Care: - Inquire further about barriers to medical care - Consult SW/CM as appropriate - [facility] as PCP at discharge  FEN/GI: Heart healthy diet, SLIV Prophylaxis: Lovenox  Disposition: pending completion of stroke workup  Subjective:  Feeling well. No pain.  Does have occasional coughing with swallowing large food boluses.  Continues to have mild weakness on R side  Objective: Temp:  [97.7 F (36.5 C)-99.4 F (37.4 C)] 98.5 F (36.9 C) (02/12 1426) Pulse Rate:  [57-74] 62 (02/12 1426) Resp:  [16-20] 20 (02/12 1426) BP: (97-126)/(54-84) 114/63 (02/12 1426) SpO2:  [94 %-100 %] 98 % (02/12 1426) Weight:  [178 lb 2.1 oz (80.8 kg)] 178 lb 2.1 oz (80.8 kg) (02/11 2200) Physical Exam: General: Alert, well-developed male in NAD Eyes: PERRLA, EOMI ENTM: Oropharynx normal, MMM Cardiovascular: RRR, S1, S2, no m/r/g Respiratory: CTAB, no increased  WOB Gastrointestinal: +BS, soft, NT, ND MSK: 5-/5 strength of RUE and RLE compared to L side. Normal tone.  Neuro: CNII-XII intact except for CNVII with R lop-sided smile. Finger-nose-finger normal bilaterally. Negative Romberg test.  Psych: Normal mood and affect.   Laboratory:  Recent Labs Lab 05/01/16 1728 05/01/16 1750  WBC 7.5  --   HGB 14.7 14.6  HCT 43.5 43.0  PLT 309  --     Recent Labs Lab 05/01/16 1728 05/01/16 1750  NA 139 141  K 3.6 3.5  CL 106 106  CO2 23  --   BUN 15 18  CREATININE 0.90 0.80  CALCIUM 9.6  --   PROT 6.5  --   BILITOT 0.4  --   ALKPHOS 75  --   ALT 19  --   AST 23  --   GLUCOSE 106* 105*     Imaging/Diagnostic Tests: Ct Angio Head W Or Wo Contrast  Result Date: 05/02/2016 CLINICAL DATA:  Dizziness that started yesterday. Slurred speech and right facial droop. EXAM: CT ANGIOGRAPHY HEAD AND NECK TECHNIQUE: Multidetector CT imaging of the head and neck was performed using the standard protocol during bolus administration of intravenous contrast. Multiplanar CT image reconstructions and MIPs were obtained to evaluate the vascular anatomy. Carotid stenosis measurements (when applicable) are obtained utilizing NASCET criteria, using the distal internal carotid diameter as the denominator. CONTRAST:  50 cc Isovue 370 intravenous COMPARISON:  None. FINDINGS: CTA NECK FINDINGS Aortic arch: Unremarkable.  Three vessel branching. Right carotid system: Smooth and widely patent. No atheromatous changes. Left carotid system: Smooth and widely patent. No  atheromatous changes. Vertebral arteries: Smooth and widely patent. No atheromatous changes. Negative proximal subclavian arteries. Skeleton: No acute or aggressive finding. Other neck: No significant incidental finding. Upper chest: Negative Review of the MIP images confirms the above findings CTA HEAD FINDINGS Limited by venous contamination. Anterior circulation: Small if any anterior communicating artery.  Posterior communicating arteries bilaterally. No major branch occlusion or flow limiting stenosis. No beading or aneurysm. 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 Possible moderate stenosis of the right P1 segment, although limited by small vessel size. Major branch occlusion. Negative for aneurysm or beading. Venous sinuses: Patent Anatomic variants: As above Delayed phase: Left paramedian pontine infarct is noted. No acute hemorrhage. Review of the MIP images confirms the above findings IMPRESSION: 1. Acute left pontine infarct. 2. No emergent large vessel occlusion or proximal stenosis. No definite atheromatous changes. 3. 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 Electronically Signed   By: Marnee Spring M.D.   On: 05/02/2016 09:35   Ct Head Wo Contrast  Result Date: 05/01/2016 CLINICAL DATA:  Right arm weakness and slurred speech. Facial droop. EXAM: CT HEAD WITHOUT CONTRAST TECHNIQUE: Contiguous axial images were obtained from the base of the skull through the vertex without intravenous contrast. COMPARISON:  None. FINDINGS: Brain: No evidence of acute infarction, hemorrhage, hydrocephalus, extra-axial collection or mass lesion/mass effect. Vascular: No hyperdense vessel or unexpected calcification. Skull: Normal. Negative for fracture or focal lesion. Sinuses/Orbits: No acute finding. Other: None. IMPRESSION: No acute intracranial abnormalities. Electronically Signed   By: Gerome Sam III M.D   On: 05/01/2016 18:25   Ct Angio Neck W Or Wo Contrast  Result Date: 05/02/2016 CLINICAL DATA:  Dizziness that started yesterday. Slurred speech and right facial droop. EXAM: CT ANGIOGRAPHY HEAD AND NECK TECHNIQUE: Multidetector CT imaging of the head and neck was performed using the standard protocol during bolus administration of  intravenous contrast. Multiplanar CT image reconstructions and MIPs were obtained to evaluate the vascular anatomy. Carotid stenosis measurements (when applicable) are obtained utilizing NASCET criteria, using the distal internal carotid diameter as the denominator. CONTRAST:  50 cc Isovue 370 intravenous COMPARISON:  None. FINDINGS: CTA NECK FINDINGS Aortic arch: Unremarkable.  Three vessel branching. Right carotid system: Smooth and widely patent. No atheromatous changes. Left carotid system: Smooth and widely patent. No atheromatous changes. Vertebral arteries: Smooth and widely patent. No atheromatous changes. Negative proximal subclavian arteries. Skeleton: No acute or aggressive finding. Other neck: No significant incidental finding. Upper chest: Negative Review of the MIP images confirms the above findings CTA HEAD FINDINGS Limited by venous contamination. Anterior circulation: Small if any anterior communicating artery. Posterior communicating arteries bilaterally. No major branch occlusion or flow limiting stenosis. No beading or aneurysm. 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 Possible moderate stenosis of the right P1 segment, although limited by small vessel size. Major branch occlusion. Negative for aneurysm or beading. Venous sinuses: Patent Anatomic variants: As above Delayed phase: Left paramedian pontine infarct is noted. No acute hemorrhage. Review of the MIP images confirms the above findings IMPRESSION: 1. Acute left pontine infarct. 2. No emergent large vessel occlusion or proximal stenosis. No definite atheromatous changes. 3. 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 Electronically Signed   By: Kathrynn Ducking.D.  On: 05/02/2016 09:35   Mr Brain Wo Contrast  Result Date: 05/02/2016 CLINICAL DATA:  Stroke. Two day  history of dizziness and slurred speech and right facial droop. Right body numbness. EXAM: MRI HEAD WITHOUT CONTRAST TECHNIQUE: Multiplanar, multiecho pulse sequences of the brain and surrounding structures were obtained without intravenous contrast. COMPARISON:  CT 05/02/2016 FINDINGS: Brain: Acute infarct left paramedian pons measuring approximately 10 x 20 mm. This shows restricted diffusion. No other acute infarct is identified Ventricle size normal. No significant chronic ischemia. Negative for hemorrhage or mass. Vascular: Normal arterial flow void. 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 Skull and upper cervical spine: Negative Sinuses/Orbits: Mild mucosal edema paranasal sinuses. No orbital lesion. Other: None IMPRESSION: 10 x 20 mm acute infarct left paramedian pons. No significant chronic ischemia. Mild mucosal edema paranasal sinuses. Electronically Signed   By: Marlan Palau M.D.   On: 05/02/2016 10:42     Erasmo Downer, MD 05/02/2016, 4:22 PM PGY-3, [facility] FPTS Intern pager: [phone], text pages welcome

## 2016-05-02 NOTE — Progress Notes (Signed)
Family Medicine Teaching Service MEDICAL STUDENT Daily Progress Note For full and approved plan, please see resident's attested note Intern Pager: [phone]  Patient name: Steve Callahan Medical record number: [id number] Date of birth: July 05, 1960 Age: 56 y.o. Gender: male  Primary Care Provider: No primary care provider on file. Consultants: Neurology Code Status: Full  Assessment and Plan: Steve Callahan is a 56 y.o. Spanish speaking male with no significant PMH presenting with acute onset dizziness, R sided weakness/numbness, slurred speech, and right facial droop concerning for stroke found on MRI brain to have acute infarct of the left paramedian pons.  #Left paramedial pontine infarct: stable. MRI brain 05/02/16 c/w acute infarct of left paramedian pons. Some dizziness ongoing. Concern for difficulty swallowing with 2-3 choking episodes. Overnight VSS. Repeat EKG was NSR with no ischemic changes. Labs significant for Tchol 161, HDL 37, TGs 159, TSH wnl, CK wnl. CTA head and neck with no large vessel occlusions or atheroslcerotic changes. PT and OT consults recommended as outpatient. - F/u: A1c - F/u ECHO read - ASA 81 mg daily - Start atorvastatin 40 mg daily - Telemetry - q4h Neuro checks - Formal SLP swallow study - PT/OT following  #Access to Medical Care: Patient reports he lives in [place], [place]. He does not have a PCP. Is interested in following up with a PCP at [facility] as an outpatient. - Will schedule with Dr. Olene Floss - Continue to inquire further about barriers to medical care - Consult Social Work to help patient with any insurance options available  FEN/GI: Heart healthy diet, d/c IVF Prophylaxis: Lovenox  Disposition: Inpatient  Subjective:  Attempted to see pt this morning, but was in MRI. Saw later in the afternoon and he reports that he has been told that he had a stroke. He denies any questions at this time. He feels about the same as  he did on Saturday, with some dizziness and speech deficits. Does note that he has had 2-3 episodes of choking when he is eating that feel like he can't get his food down, so he has to bring it back up to chew more.  Objective: Temp:  [97.7 F (36.5 C)-99.4 F (37.4 C)] 98.1 F (36.7 C) (02/12 0600) Pulse Rate:  [57-74] 57 (02/12 0600) Resp:  [16-19] 16 (02/12 0600) BP: (97-126)/(54-84) 99/54 (02/12 0600) SpO2:  [94 %-100 %] 98 % (02/12 0600) Weight:  [80.8 kg (178 lb 2.1 oz)] 80.8 kg (178 lb 2.1 oz) (02/11 2200)   Physical Exam: General: Pleasant, well-groomed man, appears stated age, sitting upright in chair, no apparent distress. Cardiovascular: Regular rhythm, normal rate. Distant heart sounds with no murmurs rubs or gallops. Radial pulse 2+ bilaterally. No peripheral edema. Respiratory: Clear to auscultation bilaterally, no wheezes/rales/rhonci. No increased work of breathing. Neuro: Right sided facial droop, holding right arm stiffly at his side, strength 4/5 in RUE and RLE, decreased sensation in RUE as well.   Laboratory: Admission on 05/01/2016  Component Date Value Ref Range Status  . Prothrombin Time 05/01/2016 12.9  11.4 - 15.2 seconds Final  . INR 05/01/2016 0.98   Final  . aPTT 05/01/2016 32  24 - 36 seconds Final  . WBC 05/01/2016 7.5  4.0 - 10.5 K/uL Final  . RBC 05/01/2016 5.10  4.22 - 5.81 MIL/uL Final  . Hemoglobin 05/01/2016 14.7  13.0 - 17.0 g/dL Final  . HCT 05/01/2016 43.5  39.0 - 52.0 % Final  . MCV 05/01/2016 85.3  78.0 - 100.0 fL  Final  . MCH 05/01/2016 28.8  26.0 - 34.0 pg Final  . MCHC 05/01/2016 33.8  30.0 - 36.0 g/dL Final  . RDW 05/01/2016 12.5  11.5 - 15.5 % Final  . Platelets 05/01/2016 309  150 - 400 K/uL Final  . Neutrophils Relative % 05/01/2016 64  % Final  . Neutro Abs 05/01/2016 4.8  1.7 - 7.7 K/uL Final  . Lymphocytes Relative 05/01/2016 25  % Final  . Lymphs Abs 05/01/2016 1.9  0.7 - 4.0 K/uL Final  . Monocytes Relative 05/01/2016 8  %  Final  . Monocytes Absolute 05/01/2016 0.6  0.1 - 1.0 K/uL Final  . Eosinophils Relative 05/01/2016 3  % Final  . Eosinophils Absolute 05/01/2016 0.2  0.0 - 0.7 K/uL Final  . Basophils Relative 05/01/2016 0  % Final  . Basophils Absolute 05/01/2016 0.0  0.0 - 0.1 K/uL Final  . Sodium 05/01/2016 139  135 - 145 mmol/L Final  . Potassium 05/01/2016 3.6  3.5 - 5.1 mmol/L Final  . Chloride 05/01/2016 106  101 - 111 mmol/L Final  . CO2 05/01/2016 23  22 - 32 mmol/L Final  . Glucose, Bld 05/01/2016 106* 65 - 99 mg/dL Final  . BUN 05/01/2016 15  6 - 20 mg/dL Final  . Creatinine, Ser 05/01/2016 0.90  0.61 - 1.24 mg/dL Final  . Calcium 05/01/2016 9.6  8.9 - 10.3 mg/dL Final  . Total Protein 05/01/2016 6.5  6.5 - 8.1 g/dL Final  . Albumin 05/01/2016 3.9  3.5 - 5.0 g/dL Final  . AST 05/01/2016 23  15 - 41 U/L Final  . ALT 05/01/2016 19  17 - 63 U/L Final  . Alkaline Phosphatase 05/01/2016 75  38 - 126 U/L Final  . Total Bilirubin 05/01/2016 0.4  0.3 - 1.2 mg/dL Final  . GFR calc non Af Amer 05/01/2016 >60  >60 mL/min Final  . GFR calc Af Amer 05/01/2016 >60  >60 mL/min Final   Comment: (NOTE) The eGFR has been calculated using the CKD EPI equation. This calculation has not been validated in all clinical situations. eGFR's persistently <60 mL/min signify possible Chronic Kidney Disease.   . Anion gap 05/01/2016 10  5 - 15 Final  . Troponin i, poc 05/01/2016 0.00  0.00 - 0.08 ng/mL Final  . Comment 3 05/01/2016          Final   Comment: Due to the release kinetics of cTnI, a negative result within the first hours of the onset of symptoms does not rule out myocardial infarction with certainty. If myocardial infarction is still suspected, repeat the test at appropriate intervals.   . Glucose-Capillary 05/01/2016 119* 65 - 99 mg/dL Final  . Sodium 05/01/2016 141  135 - 145 mmol/L Final  . Potassium 05/01/2016 3.5  3.5 - 5.1 mmol/L Final  . Chloride 05/01/2016 106  101 - 111 mmol/L Final  .  BUN 05/01/2016 18  6 - 20 mg/dL Final  . Creatinine, Ser 05/01/2016 0.80  0.61 - 1.24 mg/dL Final  . Glucose, Bld 05/01/2016 105* 65 - 99 mg/dL Final  . Calcium, Ion 05/01/2016 1.20  1.15 - 1.40 mmol/L Final  . TCO2 05/01/2016 25  0 - 100 mmol/L Final  . Hemoglobin 05/01/2016 14.6  13.0 - 17.0 g/dL Final  . HCT 05/01/2016 43.0  39.0 - 52.0 % Final  . Cholesterol 05/02/2016 161  0 - 200 mg/dL Final  . Triglycerides 05/02/2016 159* <150 mg/dL Final  . HDL 05/02/2016 37* >40 mg/dL  Final  . Total CHOL/HDL Ratio 05/02/2016 4.4  RATIO Final  . VLDL 05/02/2016 32  0 - 40 mg/dL Final  . LDL Cholesterol 05/02/2016 92  0 - 99 mg/dL Final   Comment:        Total Cholesterol/HDL:CHD Risk Coronary Heart Disease Risk Table                     Men   Women  1/2 Average Risk   3.4   3.3  Average Risk       5.0   4.4  2 X Average Risk   9.6   7.1  3 X Average Risk  23.4   11.0        Use the calculated Patient Ratio above and the CHD Risk Table to determine the patient's CHD Risk.        ATP III CLASSIFICATION (LDL):  <100     mg/dL   Optimal  100-129  mg/dL   Near or Above                    Optimal  130-159  mg/dL   Borderline  160-189  mg/dL   High  >190     mg/dL   Very High   . Weight 05/02/2016 2850.11  oz In process  . Height 05/02/2016 66  in In process  . BP 05/02/2016 119/62  mmHg In process  . TSH 05/02/2016 2.363  0.350 - 4.500 uIU/mL Final  . Total CK 05/02/2016 103  49 - 397 U/L Final   Imaging/Diagnostic Tests: Ct Angio Head W Or Wo Contrast  Result Date: 05/02/2016 CLINICAL DATA:  Dizziness that started yesterday. Slurred speech and right facial droop. EXAM: CT ANGIOGRAPHY HEAD AND NECK TECHNIQUE: Multidetector CT imaging of the head and neck was performed using the standard protocol during bolus administration of intravenous contrast. Multiplanar CT image reconstructions and MIPs were obtained to evaluate the vascular anatomy. Carotid stenosis measurements (when applicable)  are obtained utilizing NASCET criteria, using the distal internal carotid diameter as the denominator. CONTRAST:  50 cc Isovue 370 intravenous COMPARISON:  None. FINDINGS: CTA NECK FINDINGS Aortic arch: Unremarkable.  Three vessel branching. Right carotid system: Smooth and widely patent. No atheromatous changes. Left carotid system: Smooth and widely patent. No atheromatous changes. Vertebral arteries: Smooth and widely patent. No atheromatous changes. Negative proximal subclavian arteries. Skeleton: No acute or aggressive finding. Other neck: No significant incidental finding. Upper chest: Negative Review of the MIP images confirms the above findings CTA HEAD FINDINGS Limited by venous contamination. Anterior circulation: Small if any anterior communicating artery. Posterior communicating arteries bilaterally. No major branch occlusion or flow limiting stenosis. No beading or aneurysm. 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 Possible moderate stenosis of the right P1 segment, although limited by small vessel size. Major branch occlusion. Negative for aneurysm or beading. Venous sinuses: Patent Anatomic variants: As above Delayed phase: Left paramedian pontine infarct is noted. No acute hemorrhage. Review of the MIP images confirms the above findings IMPRESSION: 1. Acute left pontine infarct. 2. No emergent large vessel occlusion or proximal stenosis. No definite atheromatous changes. 3. 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 Electronically Signed   By: Monte Fantasia M.D.   On: 05/02/2016 09:35   Ct Head Wo Contrast  Result Date: 05/01/2016 CLINICAL DATA:  Right arm weakness  and slurred speech. Facial droop. EXAM: CT HEAD WITHOUT CONTRAST TECHNIQUE: Contiguous axial images were obtained from the base of the skull through the vertex  without intravenous contrast. COMPARISON:  None. FINDINGS: Brain: No evidence of acute infarction, hemorrhage, hydrocephalus, extra-axial collection or mass lesion/mass effect. Vascular: No hyperdense vessel or unexpected calcification. Skull: Normal. Negative for fracture or focal lesion. Sinuses/Orbits: No acute finding. Other: None. IMPRESSION: No acute intracranial abnormalities. Electronically Signed   By: Dorise Bullion III M.D   On: 05/01/2016 18:25   Ct Angio Neck W Or Wo Contrast  Result Date: 05/02/2016 CLINICAL DATA:  Dizziness that started yesterday. Slurred speech and right facial droop. EXAM: CT ANGIOGRAPHY HEAD AND NECK TECHNIQUE: Multidetector CT imaging of the head and neck was performed using the standard protocol during bolus administration of intravenous contrast. Multiplanar CT image reconstructions and MIPs were obtained to evaluate the vascular anatomy. Carotid stenosis measurements (when applicable) are obtained utilizing NASCET criteria, using the distal internal carotid diameter as the denominator. CONTRAST:  50 cc Isovue 370 intravenous COMPARISON:  None. FINDINGS: CTA NECK FINDINGS Aortic arch: Unremarkable.  Three vessel branching. Right carotid system: Smooth and widely patent. No atheromatous changes. Left carotid system: Smooth and widely patent. No atheromatous changes. Vertebral arteries: Smooth and widely patent. No atheromatous changes. Negative proximal subclavian arteries. Skeleton: No acute or aggressive finding. Other neck: No significant incidental finding. Upper chest: Negative Review of the MIP images confirms the above findings CTA HEAD FINDINGS Limited by venous contamination. Anterior circulation: Small if any anterior communicating artery. Posterior communicating arteries bilaterally. No major branch occlusion or flow limiting stenosis. No beading or aneurysm. 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 Possible moderate stenosis of the right P1 segment, although limited by small vessel size. Major branch occlusion. Negative for aneurysm or beading. Venous sinuses: Patent Anatomic variants: As above Delayed phase: Left paramedian pontine infarct is noted. No acute hemorrhage. Review of the MIP images confirms the above findings IMPRESSION: 1. Acute left pontine infarct. 2. No emergent large vessel occlusion or proximal stenosis. No definite atheromatous changes. 3. 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 Electronically Signed   By: Monte Fantasia M.D.   On: 05/02/2016 09:35   Mr Brain Wo Contrast  Result Date: 05/02/2016 CLINICAL DATA:  Stroke. Two day history of dizziness and slurred speech and right facial droop. Right body numbness. EXAM: MRI HEAD WITHOUT CONTRAST TECHNIQUE: Multiplanar, multiecho pulse sequences of the brain and surrounding structures were obtained without intravenous contrast. COMPARISON:  CT 05/02/2016 FINDINGS: Brain: Acute infarct left paramedian pons measuring approximately 10 x 20 mm. This shows restricted diffusion. No other acute infarct is identified Ventricle size normal. No significant chronic ischemia. Negative for hemorrhage or mass. Vascular: Normal arterial flow void. 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 Skull and upper cervical spine: Negative Sinuses/Orbits: Mild mucosal edema paranasal sinuses. No orbital lesion. Other: None IMPRESSION: 10 x 20 mm acute infarct left paramedian pons. No significant chronic ischemia. Mild mucosal edema paranasal sinuses. Electronically Signed   By: Franchot Gallo M.D.   On: 05/02/2016 10:42   Rosario Adie, Medical Student 05/02/2016, 7:17 AM FPTS Intern pager: [phone], text pages welcome

## 2016-05-02 NOTE — Discharge Summary (Signed)
Family [facility] Summary  Patient name: Steve Callahan Medical record number: [id number] Date of birth: 07/19/1960 Age: 56 y.o. Gender: male Date of Admission: 05/01/2016  Date of Discharge: 05/03/2016 Admitting Physician: Leighton Roachodd D McDiarmid, MD  Primary Care Provider: No primary care provider on file. Consultants: Neurology  Indication for Hospitalization: Left paramedian pontine infarct (CVA)  Discharge Diagnoses/Problem List:  1. Left paramedian pontine infarct (CVA) 2. Right sided weakness 2/2 CVA 3. Hypertriglyceridemia  Disposition: Home  Discharge Condition: Stable  Discharge Exam:  General: Pleasant, well-groomed Spanish speaking man, appears stated age, sitting upright in bed with empty breakfast tray, no apparent distress. Cardiovascular: Regular rhythm, normal rate. Distant heart sounds with no murmurs rubs or gallops. Radial pulse 2+ bilaterally. Respiratory: Clear to auscultation bilaterally, no wheezes/rales/rhonci. No increased work of breathing. Neuro: Right sided facial droop unchanged, holding right arm stiffly at his side, strength 4-5/5 in RUE, 5/5 in RLE, grip strength 5/5 bilaterally.  Brief Hospital Course:  Wilford SportsJuan Floresis a 56 y.o.Spanish speaking malewith no significant PMH who presented with acute onset dizziness, R sided weakness/numbness, slurred speech,and rightfacial droop concerning for stroke found on MRI brain 05/02/16 to have acute infarct of the left paramedian pons. Brief course by problem as follows:  #Left paramedian pontine infarct: stable. Presented with 2 days acute onset dizziness, right sided weakness and tingling sensation, slurred speech, and right sided facial droop. Initial CT head read as negative for acute infarct and hemorrhage by radiology, but neurology felt it showed a pontine infarct. MRI brain later confirmed paramedian pontine CVA. Per Neurology consult, the most likely etiologies of CVA in this pt  were small vessel disease vs diminutive posterior circulation noted on CTA head and MRI, in the setting of no traditional risk factors (no AFib, structural heart disease, negative CTA head/neck, HbA1c 5.5%, no HLD). PT and OT saw the patient and recommended outpatient therapy. On discharge, patient was tolerating a regular diet and thin liquids. He will be discharged on ASA 81 mg daily and atorvastatin 40 mg daily.  #Barriers to care Pt does not have insurance. Care management and social work were consulted and discussed different options to obtain care with the patient. [facility]  Issues for Follow Up:  1. Monitor neurological deficits (see Neurology progress note 05/02/16 for full exam for comparison) 2. Healthcare maintenance needed, as patient has not seen a PCP 3. Able to obtain outpt PT/OT? Involve outpatient social work if needed.  Significant Procedures: none  Significant Labs and Imaging:   Recent Labs Lab 05/01/16 1728 05/01/16 1750  WBC 7.5  --   HGB 14.7 14.6  HCT 43.5 43.0  PLT 309  --     Recent Labs Lab 05/01/16 1728 05/01/16 1750  NA 139 141  K 3.6 3.5  CL 106 106  CO2 23  --   GLUCOSE 106* 105*  BUN 15 18  CREATININE 0.90 0.80  CALCIUM 9.6  --   ALKPHOS 75  --   AST 23  --   ALT 19  --   ALBUMIN 3.9  --    Lipid Panel     Component Value Date/Time   CHOL 161 05/02/2016 0751   TRIG 159 (H) 05/02/2016 0751   HDL 37 (L) 05/02/2016 0751   CHOLHDL 4.4 05/02/2016 0751   VLDL 32 05/02/2016 0751   LDLCALC 92 05/02/2016 0751   Lab Results  Component Value Date  HGBA1C 5.5 05/02/2016   Ct Angio Head W Or Wo Contrast  Result Date: 05/02/2016 CLINICAL DATA:  Dizziness that started yesterday. Slurred speech and right facial droop. EXAM: CT ANGIOGRAPHY HEAD AND NECK TECHNIQUE: Multidetector CT imaging of the head and neck was performed using the standard protocol during bolus  administration of intravenous contrast. Multiplanar CT image reconstructions and MIPs were obtained to evaluate the vascular anatomy. Carotid stenosis measurements (when applicable) are obtained utilizing NASCET criteria, using the distal internal carotid diameter as the denominator. CONTRAST:  50 cc Isovue 370 intravenous COMPARISON:  None. FINDINGS: CTA NECK FINDINGS Aortic arch: Unremarkable.  Three vessel branching. Right carotid system: Smooth and widely patent. No atheromatous changes. Left carotid system: Smooth and widely patent. No atheromatous changes. Vertebral arteries: Smooth and widely patent. No atheromatous changes. Negative proximal subclavian arteries. Skeleton: No acute or aggressive finding. Other neck: No significant incidental finding. Upper chest: Negative Review of the MIP images confirms the above findings CTA HEAD FINDINGS Limited by venous contamination. Anterior circulation: Small if any anterior communicating artery. Posterior communicating arteries bilaterally. No major branch occlusion or flow limiting stenosis. No beading or aneurysm. 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 Possible moderate stenosis of the right P1 segment, although limited by small vessel size. Major branch occlusion. Negative for aneurysm or beading. Venous sinuses: Patent Anatomic variants: As above Delayed phase: Left paramedian pontine infarct is noted. No acute hemorrhage. Review of the MIP images confirms the above findings IMPRESSION: 1. Acute left pontine infarct. 2. No emergent large vessel occlusion or proximal stenosis. No definite atheromatous changes. 3. 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 Electronically Signed   By: Marnee Spring M.D.   On: 05/02/2016 09:35   Ct Head Wo Contrast  Result Date: 05/01/2016 CLINICAL DATA:   Right arm weakness and slurred speech. Facial droop. EXAM: CT HEAD WITHOUT CONTRAST TECHNIQUE: Contiguous axial images were obtained from the base of the skull through the vertex without intravenous contrast. COMPARISON:  None. FINDINGS: Brain: No evidence of acute infarction, hemorrhage, hydrocephalus, extra-axial collection or mass lesion/mass effect. Vascular: No hyperdense vessel or unexpected calcification. Skull: Normal. Negative for fracture or focal lesion. Sinuses/Orbits: No acute finding. Other: None. IMPRESSION: No acute intracranial abnormalities. Electronically Signed   By: Gerome Sam III M.D   On: 05/01/2016 18:25   Ct Angio Neck W Or Wo Contrast  Result Date: 05/02/2016 CLINICAL DATA:  Dizziness that started yesterday. Slurred speech and right facial droop. EXAM: CT ANGIOGRAPHY HEAD AND NECK TECHNIQUE: Multidetector CT imaging of the head and neck was performed using the standard protocol during bolus administration of intravenous contrast. Multiplanar CT image reconstructions and MIPs were obtained to evaluate the vascular anatomy. Carotid stenosis measurements (when applicable) are obtained utilizing NASCET criteria, using the distal internal carotid diameter as the denominator. CONTRAST:  50 cc Isovue 370 intravenous COMPARISON:  None. FINDINGS: CTA NECK FINDINGS Aortic arch: Unremarkable.  Three vessel branching. Right carotid system: Smooth and widely patent. No atheromatous changes. Left carotid system: Smooth and widely patent. No atheromatous changes. Vertebral arteries: Smooth and widely patent. No atheromatous changes. Negative proximal subclavian arteries. Skeleton: No acute or aggressive finding. Other neck: No significant incidental finding. Upper chest: Negative Review of the MIP images confirms the above findings CTA HEAD FINDINGS Limited by venous contamination. Anterior circulation: Small if any anterior communicating artery. Posterior communicating arteries  bilaterally. No  major branch occlusion or flow limiting stenosis. No beading or aneurysm. 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 Possible moderate stenosis of the right P1 segment, although limited by small vessel size. Major branch occlusion. Negative for aneurysm or beading. Venous sinuses: Patent Anatomic variants: As above Delayed phase: Left paramedian pontine infarct is noted. No acute hemorrhage. Review of the MIP images confirms the above findings IMPRESSION: 1. Acute left pontine infarct. 2. No emergent large vessel occlusion or proximal stenosis. No definite atheromatous changes. 3. 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 Electronically Signed   By: Marnee Spring M.D.   On: 05/02/2016 09:35   Mr Brain Wo Contrast  Result Date: 05/02/2016 CLINICAL DATA:  Stroke. Two day history of dizziness and slurred speech and right facial droop. Right body numbness. EXAM: MRI HEAD WITHOUT CONTRAST TECHNIQUE: Multiplanar, multiecho pulse sequences of the brain and surrounding structures were obtained without intravenous contrast. COMPARISON:  CT 05/02/2016 FINDINGS: Brain: Acute infarct left paramedian pons measuring approximately 10 x 20 mm. This shows restricted diffusion. No other acute infarct is identified Ventricle size normal. No significant chronic ischemia. Negative for hemorrhage or mass. Vascular: Normal arterial flow void. 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 Skull and upper cervical spine: Negative Sinuses/Orbits: Mild mucosal edema paranasal sinuses. No orbital lesion. Other: None IMPRESSION: 10 x 20 mm acute infarct left paramedian pons. No significant chronic ischemia. Mild mucosal edema paranasal sinuses. Electronically Signed   By: Marlan Palau M.D.   On: 05/02/2016 10:42    Results/Tests Pending at Time of Discharge: None  Discharge Medications:  Allergies as of 05/03/2016   No Known Allergies     Medication List    TAKE these medications   aspirin 81 MG EC tablet Take 1 tablet (81 mg total) by mouth daily. Start taking on:  05/04/2016   atorvastatin 40 MG tablet Commonly known as:  LIPITOR Take 1 tablet (40 mg total) by mouth daily at 6 PM.       Discharge Instructions: Please refer to Patient Instructions section of EMR for full details.  Patient was counseled important signs and symptoms that should prompt return to medical care, changes in medications, dietary instructions, activity restrictions, and follow up appointments.   Follow-Up Appointments: Follow-up Information    Mickie Hillier, MD. Go on 05/06/2016.   Specialty:  Family Medicine Why:  Appointment at 11:15am.  Please arrive 15 minutes early. Contact information: [address] [place] [address] [phone]        [facility]. Schedule an appointment as soon as possible for a visit.   Why:  Please schedule an appointment for a visit in about 6 weeks with Dr. Darrol Angel at [facility]. Contact information: [address]     [address] [place] [address] [phone]       [facility] Follow up.   Specialty:  Rehabilitation Why:  they will contact you for the first appointment. Contact information: [address] [id number] mc [place] [address] [phone]         Biagio Quint, Medical Student 05/03/2016, 2:26 PM

## 2016-05-02 NOTE — Progress Notes (Signed)
STROKE TEAM PROGRESS NOTE   HISTORY OF PRESENT ILLNESS (per record) Steve Callahan is an 56 y.o. male who presented to the ED with 2 day history of dizziness, slurred speech, right facial droop and numbness along the right side of his body. Denies confusion or difficulty comprehending speech. His sentences have had normal grammar (he speaks Spanish), but with slurring. He has no prior history of stroke. Onset of the symptoms was sudden. Patient was not administered IV t-PA . He was admitted for further evaluation and treatment.   SUBJECTIVE (INTERVAL HISTORY) Daughter and son are at bedside. Patient is Spanish-speaking only. Daughter act as Equities trader. Patient still has right facial droop, however right side hemiparesis is really mild. MRI showed left pontine infarct. Patient denies history of hypertension, diabetes, hyperlipidemia, smoking, alcohol use, illicit drugs or OSA.   OBJECTIVE Temp:  [97.7 F (36.5 C)-99.4 F (37.4 C)] 98.5 F (36.9 C) (02/12 1112) Pulse Rate:  [57-74] 57 (02/12 1112) Cardiac Rhythm: Heart block (02/12 0700) Resp:  [16-19] 19 (02/12 1112) BP: (97-126)/(54-84) 119/62 (02/12 1112) SpO2:  [94 %-100 %] 99 % (02/12 1112) Weight:  [80.8 kg (178 lb 2.1 oz)] 80.8 kg (178 lb 2.1 oz) (02/11 2200)  CBC:  Recent Labs Lab 05/01/16 1728 05/01/16 1750  WBC 7.5  --   NEUTROABS 4.8  --   HGB 14.7 14.6  HCT 43.5 43.0  MCV 85.3  --   PLT 309  --     Basic Metabolic Panel:  Recent Labs Lab 05/01/16 1728 05/01/16 1750  NA 139 141  K 3.6 3.5  CL 106 106  CO2 23  --   GLUCOSE 106* 105*  BUN 15 18  CREATININE 0.90 0.80  CALCIUM 9.6  --     Lipid Panel:    Component Value Date/Time   CHOL 161 05/02/2016 0751   TRIG 159 (H) 05/02/2016 0751   HDL 37 (L) 05/02/2016 0751   CHOLHDL 4.4 05/02/2016 0751   VLDL 32 05/02/2016 0751   LDLCALC 92 05/02/2016 0751   HgbA1c: No results found for: HGBA1C Urine Drug Screen: No results found for: LABOPIA, COCAINSCRNUR,  LABBENZ, AMPHETMU, THCU, LABBARB    IMAGING I have personally reviewed the radiological images below and agree with the radiology interpretations. Pink text is my interpretation   Ct Head Wo Contrast 05/01/2016 No acute intracranial abnormalities. However, left pontine infarct is obvious  Ct Angio Head W Or Wo Contrast Ct Angio Neck W Or Wo Contrast 05/02/2016 1. Acute left pontine infarct. 2. No emergent large vessel occlusion or proximal stenosis. No definite atheromatous changes. 3. 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 No significant chronic ischemia. Mild mucosal edema paranasal sinuses.   TTE - pending   PHYSICAL EXAM  Temp:  [97.7 F (36.5 C)-99.4 F (37.4 C)] 98.5 F (36.9 C) (02/12 1426) Pulse Rate:  [57-74] 62 (02/12 1426) Resp:  [16-20] 20 (02/12 1426) BP: (97-126)/(54-84) 114/63 (02/12 1426) SpO2:  [94 %-100 %] 98 % (02/12 1426) Weight:  [178 lb 2.1 oz (80.8 kg)] 178 lb 2.1 oz (80.8 kg) (02/11 2200)  General - Well nourished, well developed, in no apparent distress.  Ophthalmologic - Fundi not visualized due to noncooperation.  Cardiovascular - Regular rate and rhythm.  Mental Status -  Level of arousal and orientation to time, place, and person were intact. Language including  expression, naming, repetition, comprehension was assessed and found intact. Fund of Knowledge was assessed and was intact.  Cranial Nerves II - XII - II - Visual field intact OU. III, IV, VI - Extraocular movements intact. V - Facial sensation slightly decreased on the right. VII - significant right facial droop. VIII - Hearing & vestibular intact bilaterally. X - Palate elevates symmetrically. XI - Chin turning & shoulder shrug intact bilaterally. XII - Tongue protrusion intact.  Motor Strength - The patient's strength  was normal in all extremities except RUE 4+/5 proximal and 4/5 distal with pronator drift.  Bulk was normal and fasciculations were absent.   Motor Tone - Muscle tone was assessed at the neck and appendages and was normal.  Reflexes - The patient's reflexes were 1+ in all extremities and he had no pathological reflexes.  Sensory - Light touch, temperature/pinprick were assessed and were symmetrical.    Coordination - The patient had normal movements in the hands with no ataxia or dysmetria.  Tremor was absent.  Gait and Station - deferred as patient is going to have TTE.   ASSESSMENT/PLAN Mr. Steve Callahan is a 56 y.o. male with no significant past medical history presenting with dizziness, slurred speech, right facial droop and R body numbness. He did not receive IV t-PA.   Stroke:  L paramedian pontine infarct secondary to small vessel disease source. However, vision does not have traditional stroke risk factors. Diminutive posterior circulation also a possible cause.   Resultant  right facial droop, right pronator drift   CT head  left pontine infarct  CT angiogram head and neck No LVO, but diminutive posterior circulation    MRI  left paramedian pontine infarct  2D Echo  pending  LDL 92  HgbA1c pending  Lovenox 40 mg sq daily for VTE prophylaxis  Diet Heart Room service appropriate? Yes; Fluid consistency: Thin  No antithrombotic prior to admission, now on aspirin 81 mg daily. 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 Please call with questions. [facility]. Thanks for the consult.  Marvel PlanJindong Gizel Riedlinger, MD PhD Stroke Neurology 05/02/2016 4:52 PM   To contact Stroke Continuity provider, please refer to [email or web address]. After hours, contact General Neurology

## 2016-05-02 NOTE — Evaluation (Signed)
Occupational Therapy Evaluation Patient Details Name: Franck Vinal MRN: [id number] DOB: [birth year] Today's Date: 05/02/2016    History of Present Illness Pt is a 56 y/o male who presents with R sided weakness/numbness, and facial droop. 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   Clinical Impression   PTA, pt was independent with ADL and functional mobility and was working. Pt able to complete toilet transfers and standing grooming tasks with supervision for safety. He requires min guard assist for UB ADL. Pt presents with decreased R UE strength, fine motor coordination, and sensation impacting independence with higher level ADL and work tasks. Pt would benefit from continued OT services while admitted to improve independence with ADL and to address R UE deficits as related to ADL and work related tasks. Recommend outpatient OT services post-acute D/C. OT will continue to follow acutely.    Follow Up Recommendations  Outpatient OT;Supervision - Intermittent    Equipment Recommendations  None recommended by OT    Recommendations for Other Services       Precautions / Restrictions Precautions Precautions: Fall Restrictions Weight Bearing Restrictions: No      Mobility Bed Mobility Overal bed mobility: Independent                Transfers Overall transfer level: Modified independent Equipment used: None Transfers: Sit to/from Stand Sit to Stand: Modified independent (Device/Increase time)         General transfer comment: No assist required. No unsteadines noted.     Balance Overall balance assessment: Needs assistance Sitting-balance support: No upper extremity supported;Feet supported Sitting balance-Leahy Scale: Good Sitting balance - Comments: Able to don and doff socks without back support and sitting at EOB.   Standing balance support: No upper extremity supported;During functional activity Standing  balance-Leahy Scale: Fair Standing balance comment: No assist required to maintain balance however appeared unsteady at times                            ADL Overall ADL's : Needs assistance/impaired Eating/Feeding: Set up;Sitting   Grooming: Supervision/safety;Standing Grooming Details (indicate cue type and reason): Supervision for safety. Upper Body Bathing: Min guard;Sitting   Lower Body Bathing: Supervison/ safety   Upper Body Dressing : Min guard;Sitting   Lower Body Dressing: Supervision/safety;Sit to/from stand   Toilet Transfer: Supervision/safety;Ambulation   Toileting- Clothing Manipulation and Hygiene: Supervision/safety;Sit to/from stand       Functional mobility during ADLs: Supervision/safety General ADL Comments: Pt reports dizziness on longer distance ambulation but not during ADL in room at this time. Able to sequence ADL well. Able to functionally use R UE for ADL tasks but higher level fine motor skills are difficult.     Vision Vision Assessment?: No apparent visual deficits   Perception     Praxis Praxis Praxis tested?: Within functional limits    Pertinent Vitals/Pain Pain Assessment: No/denies pain     Hand Dominance Right   Extremity/Trunk Assessment Upper Extremity Assessment Upper Extremity Assessment: RUE deficits/detail RUE Deficits / Details: Pt reporting decreased sensation in palmar area of hand. Slow movement and overall 4/5 strength in R UE. Slightly decreased coordination but functional. Reports that his arm feels like it is asleep.   Lower Extremity Assessment Lower Extremity Assessment: Defer to PT evaluation RLE Deficits / Details: Slight strength deficit noted in quads, hams, and hip flexor with MMT. Overall still WFL though. No sensation differences  reported with light touch testing.    Cervical / Trunk Assessment Cervical / Trunk Assessment: Normal (Forward head/rounded shoulder posture)   Communication  Communication Communication: Prefers language other than English;Interpreter utilized   Cognition Arousal/Alertness: Awake/alert Behavior During Therapy: WFL for tasks assessed/performed Overall Cognitive Status: Impaired/Different from baseline Area of Impairment: Awareness;Problem solving           Awareness: Emergent Problem Solving: Slow processing;Decreased initiation General Comments: Daughter reports slow in speaking and responding.    General Comments       Exercises       Shoulder Instructions      Home Living Family/patient expects to be discharged to:: Private residence Living Arrangements: Spouse/significant other Available Help at Discharge: Family;Available 24 hours/day Type of Home: House Home Access: Stairs to enter Entergy CorporationEntrance Stairs-Number of Steps: 6 Entrance Stairs-Rails: None Home Layout: One level     Bathroom Shower/Tub: Chief Strategy OfficerTub/shower unit   Bathroom Toilet: Standard Bathroom Accessibility: Yes   Home Equipment: Shower seat - built in          Prior Functioning/Environment Level of Independence: Independent        Comments: Works as a Proofreaderbrick mason        OT Problem List: Decreased strength;Impaired balance (sitting and/or standing);Decreased coordination;Decreased safety awareness;Impaired UE functional use   OT Treatment/Interventions: Balance training;Patient/family education;Therapeutic activities;DME and/or AE instruction;Energy conservation;Therapeutic exercise;Neuromuscular education;Self-care/ADL training    OT Goals(Current goals can be found in the care plan section) Acute Rehab OT Goals Patient Stated Goal: Return home/to PLOF OT Goal Formulation: With patient Time For Goal Achievement: 05/16/16 Potential to Achieve Goals: Good ADL Goals Pt Will Perform Grooming: with modified independence;standing Pt Will Perform Tub/Shower Transfer: with modified independence;Tub transfer;ambulating;shower seat Additional ADL Goal #1: Pt  will independently complete fine motor coordination HEP in order to improve independence with ADL and work tasks.  OT Frequency: Min 2X/week   Barriers to D/C:            Co-evaluation              End of Session Nurse Communication: Mobility status  Activity Tolerance: Patient tolerated treatment well Patient left: in chair;with call bell/phone within reach   Time: 1522-1535 OT Time Calculation (min): 13 min Charges:  OT General Charges $OT Visit: 1 Procedure OT Evaluation $OT Eval Moderate Complexity: 1 Procedure G-CodesDoristine Section:    Abdurrahman Petersheim A Aylin Rhoads, OTR/L [phone] 05/02/2016, 3:50 PM

## 2016-05-02 NOTE — Evaluation (Signed)
Physical Therapy Evaluation Patient Details Name: Steve Callahan MRN: [id number] DOB: 07/23/1960 Today's Date: 05/02/2016   History of Present Illness  Pt is a 56 y/o male who presents with R sided weakness/numbness, and facial droop. 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  Clinical Impression  Pt admitted with above diagnosis. Pt currently with functional limitations due to the deficits listed below (see PT Problem List). At the time of PT eval pt was able to perform transfers and ambulation with modified independence to supervision for safety. LUE deficits appear to be pt's main concern/most limiting factors. Pt will benefit from skilled PT to increase their independence and safety with mobility to allow discharge to the venue listed below.       Follow Up Recommendations Outpatient PT;Supervision for mobility/OOB    Equipment Recommendations  None recommended by PT    Recommendations for Other Services       Precautions / Restrictions Precautions Precautions: Fall Restrictions Weight Bearing Restrictions: No      Mobility  Bed Mobility Overal bed mobility: Independent                Transfers Overall transfer level: Modified independent Equipment used: None Transfers: Sit to/from Stand           General transfer comment: No assist required. No unsteadines noted.   Ambulation/Gait Ambulation/Gait assistance: Supervision Ambulation Distance (Feet): 300 Feet Assistive device: None Gait Pattern/deviations: Step-through pattern;Decreased stride length;Drifts right/left;Trunk flexed Gait velocity: Decreased Gait velocity interpretation: Below normal speed for age/gender General Gait Details: Occasional drifting and close supervision required due to slight unsteadiness. Pt reports dizziness during gait training. Upon return to [facility]   Stairs            Wheelchair Mobility    Modified Rankin  (Stroke Patients Only) Modified Rankin (Stroke Patients Only) Pre-Morbid Rankin Score: No symptoms Modified Rankin: Slight disability     Balance Overall balance assessment: Needs assistance Sitting-balance support: No upper extremity supported;Feet supported Sitting balance-Leahy Scale: Fair     Standing balance support: No upper extremity supported;During functional activity Standing balance-Leahy Scale: Fair Standing balance comment: No assist required to maintain balance however appeared unsteady at times                             Pertinent Vitals/Pain Pain Assessment: No/denies pain    Home Living Family/patient expects to be discharged to:: Private residence Living Arrangements: Spouse/significant other Available Help at Discharge: Family;Available 24 hours/day Type of Home: House Home Access: Stairs to enter Entrance Stairs-Rails: None Entrance Stairs-Number of Steps: 6 Home Layout: One level Home Equipment: Shower seat - built in      Prior Function Level of Independence: Independent         Comments: Works as a Environmental consultantbrick mason     Hand Dominance   Dominant Hand: Right    Extremity/Trunk Assessment   Upper Extremity Assessment Upper Extremity Assessment: RUE deficits/detail RUE Deficits / Details: Pt reports decreased sensation in palmar ulnar and radial areas of hand. Decreased grip strength, and decreased strength at shoulder and elbow noted.     Lower Extremity Assessment Lower Extremity Assessment: RLE deficits/detail RLE Deficits / Details: Slight strength deficit noted in quads, hams, and hip flexor with MMT. Overall still WFL though. No sensation differences reported with light touch testing.     Cervical / Trunk Assessment Cervical /  Trunk Assessment: Normal (Forward head/rounded shoulder posture)  Communication   Communication: Prefers language other than English;Interpreter utilized  Cognition Arousal/Alertness:  Awake/alert Behavior During Therapy: WFL for tasks assessed/performed Overall Cognitive Status: Impaired/Different from baseline Area of Impairment: Awareness;Problem solving           Awareness: Emergent Problem Solving: Slow processing;Decreased initiation General Comments: Daughter reports slow in speaking and responding.     General Comments      Exercises     Assessment/Plan    PT Assessment Patient needs continued PT services  PT Problem List Decreased strength;Decreased range of motion;Decreased activity tolerance;Decreased balance;Decreased mobility;Decreased knowledge of use of DME;Decreased safety awareness;Decreased knowledge of precautions;Decreased cognition;Impaired sensation          PT Treatment Interventions DME instruction;Gait training;Stair training;Functional mobility training;Therapeutic activities;Therapeutic exercise;Neuromuscular re-education;Patient/family education;Cognitive remediation    PT Goals (Current goals can be found in the Care Plan section)  Acute Rehab PT Goals Patient Stated Goal: Return home/to PLOF PT Goal Formulation: With patient/family Time For Goal Achievement: 05/09/16 Potential to Achieve Goals: Good    Frequency Min 3X/week   Barriers to discharge        Co-evaluation               End of Session Equipment Utilized During Treatment: Gait belt Activity Tolerance: Patient tolerated treatment well Patient left: in chair;with call bell/phone within reach;with family/visitor present Nurse Communication: Mobility status         Time: 8657-8469 PT Time Calculation (min) (ACUTE ONLY): 27 min   Charges:   PT Evaluation $PT Eval Moderate Complexity: 1 Procedure PT Treatments $Gait Training: 8-22 mins   PT G Codes:        Marylynn Pearson 05-28-2016, 2:10 PM   Conni Slipper, PT, DPT Acute Rehabilitation Services Pager: [phone]

## 2016-05-02 NOTE — Progress Notes (Signed)
Patient admitted to room 5C-15.  Family at bedside, call light within reach.  Patient alert

## 2016-05-02 NOTE — Progress Notes (Signed)
OT Cancellation Note  Patient Details Name: Steve Callahan MRN: [id number] DOB: 03/18/1961   Cancelled Treatment:    Reason Eval/Treat Not Completed: Patient at procedure or test/ unavailable. Pt off unit at MRI at this time. Will check back as able for OT evaluation.  [address]Eyana Stolze A Zafirah Vanzee, OTR/L [phone] 05/02/2016, 10:11 AM

## 2016-05-03 LAB — HEMOGLOBIN A1C
HEMOGLOBIN A1C: 5.5 % (ref 4.8–5.6)
Mean Plasma Glucose: 111 mg/dL

## 2016-05-03 MED ORDER — ASPIRIN 81 MG PO TBEC: 81.0000 mg | DELAYED_RELEASE_TABLET | Freq: Every day | ORAL | 1 refills | Status: AC

## 2016-05-03 MED ORDER — ATORVASTATIN CALCIUM 40 MG PO TABS: 40.0000 mg | ORAL_TABLET | Freq: Every day | ORAL | 1 refills | Status: DC

## 2016-05-03 NOTE — Discharge Instructions (Signed)
Arterioesclerosis cerebral (Cerebral Arteriosclerosis) La arterioesclerosis cerebral es una enfermedad que afecta las arterias en el cerebro. Si tiene arterioesclerosis cerebral, estas arterias se engrosan, se endurecen y se estrechan ms de lo normal. Una vez que comienza este proceso nocivo, puede acumularse una sustancia viscosa (placa) en el interior de las arterias y obstruir el flujo sanguneo. Esto reduce el flujo sanguneo normal al cerebro. La sangre transporta oxgeno al cerebro. Debido a la reduccin del flujo sanguneo al cerebro, es posible que este no reciba el oxgeno necesario. Este proceso puede comenzar en las primeras etapas de la vida y acumularse con [place] tiempo. Los cogulos sanguneos o la placa que se rompen y desprenden pueden obstruir por completo el flujo sanguneo y causar daos graves y repentinos. La arterioesclerosis cerebral puede causar problemas graves como el ictus o la demencia. FACTORES DE RIESGO Entre los factores de riesgo de la arterioesclerosis cerebral se incluyen los siguientes:  Ser varn.  Edad avanzada. La probabilidad de que este trastorno aumente con la edad.  Pertenecer a la raza asitica, afroamericana o latina.  Tener antecedentes familiares de ciertas enfermedades como cardiopata o ictus.  Tener antecedentes personales de ictus o cardiopatas. Los factores relacionados con el estilo de vida que pueden aumentar su riesgo de desarrollar arterioesclerosis cerebral incluyen lo siguiente:  Fumar o estar expuesto al humo de otros fumadores.  Tener diabetes.  Tener sobrepeso.  No hacer suficiente ejercicio fsico.  Presin arterial elevada (hipertensin arterial).  Tener colesterol elevado.  Consumir una dieta de alto contenido de [place]. SIGNOS Y SNTOMAS Los sntomas ms frecuentes de arterioesclerosis cerebral incluyen lo siguiente:  Adormecimiento o debilidad de los brazos o las piernas.  Problemas en el habla.  Problemas para  tragar.  Problemas de visin.  Confusin.  Irritabilidad.  Cambios en la personalidad, como prdida de inters, irritabilidad o confusin (demencia vascular).  Dolor de cabeza o facial. DIAGNSTICO El mdico podr hacer el diagnstico de arterioesclerosis cerebral en funcin de sus sntomas y un examen fsico. Tambin pueden realizarle estudios por imgenes del cerebro para confirmar el diagnstico. Estos pueden incluir los siguientes:  Estudio de diagnstico por imgenes que utiliza ondas sonoras (ecografa).  Estudio de diagnstico por imgenes que se realiza despus de la inyeccin de un tinte (angiografa). TRATAMIENTO El tratamiento puede incluir lo siguiente:  Medicamentos para Chief Operating Officercontrolar las enfermedades que lo ponen en riesgo de Environmental education officerdesarrollar arterioesclerosis cerebral. Estas afecciones pueden incluir lo siguiente:  Hipertensin arterial.  Colesterol elevado.  Diabetes.  Tomar medicamentos anticoagulantes y para prevenir el ictus.  Ciruga. Esta puede ser de diversas maneras:  Colocacin de stent intracraneal. En este procedimiento se localiza una arteria obstruida en el cerebro, se la ensancha y [place]mantiene abierta.  Ciruga para revascularizar una arteria obstruida con una arteria sana que se toma de [place]ra parte del cuerpo. INSTRUCCIONES PARA EL CUIDADO EN EL HOGAR  Tome los medicamentos solamente como se lo haya indicado el mdico.  No consuma ningn producto que contenga tabaco, lo que incluye cigarrillos, tabaco de Theatre managermascar o Administrator, Civil Servicecigarrillos electrnicos. Si necesita ayuda para dejar de fumar, consulte al mdico.  Consuma una dieta con bajo contenido de [place], colesterol y [place], [place] se lo haya indicado el mdico.  Siga un programa de actividad fsica aprobado por el mdico.  Mantenga un peso saludable. Baje de peso, si es necesario.  Trabaje con el mdico para controlar otros problemas de [place], como hipertensin y diabetes.  Concurra a todas las visitas de control  como se lo haya indicado  el mdico. Esto es importante. SOLICITE ATENCIN MDICA SI:  Sufre cefaleas frecuentes.  Tiene mareos.  Usted u otra persona observan cambios en su personalidad.  Tiene dolor facial.  Tiene perodos de confusin. SOLICITE ATENCIN MDICA DE INMEDIATO SI:  Siente un dolor de cabeza muy intenso.  Siente debilidad o adormecimiento en los brazos o en las piernas.  Tiene parlisis facial.  Presenta dificultades para hablar.  Tiene dificultad para tragar.  Tiene dificultad para comprender lo que Dealer.  Se siente repentinamente confundido.  Tiene problemas de visin repentinos.  Sufre convulsiones o la prdida de la conciencia. Cualquiera de estos sntomas puede representar un problema grave y es Radio broadcast assistant. No espere hasta que los sntomas desaparezcan. Solicite atencin mdica de inmediato. Comunquese con el servicio de emergencias de su localidad (911 en los [place]). No conduzca por sus propios medios [facility].  ASEGRESE DE QUE:  Comprende estas instrucciones.  Controlar su afeccin.  Recibir ayuda de inmediato si no mejora o si empeora. Esta informacin no tiene Theme park manager el consejo del mdico. Asegrese de hacerle al mdico cualquier pregunta que tenga. Document Released: 06/23/2008 Document Revised: 03/28/2014 Document Reviewed: 04/25/2013 Elsevier Interactive Patient Education  2017 [employer]. Steve Callahan,  You were admitted for stroke. Medications to reduce risk of another stroke include cholesterol-lowering medication (lipitor) and blood thinner (aspirin). We recommend physical therapy and occupational therapy to work on getting your strength back.

## 2016-05-03 NOTE — Evaluation (Signed)
Clinical/Bedside Swallow Evaluation Patient Details  Name: Steve Callahan MRN: [id number] Date of Birth: 12/13/1960  Today's Date: 05/03/2016 Time: SLP Start Time (ACUTE ONLY): 1010 SLP Stop Time (ACUTE ONLY): 1030 SLP Time Calculation (min) (ACUTE ONLY): 20 min  Past Medical History: History reviewed. No pertinent past medical history. Past Surgical History: History reviewed. No pertinent surgical history. HPI:  Steve Callahan an 56 y.o.malewho presented to the ED with 2 day history of dizziness, slurred speech, right facial droop and numbness along the right side of his body. Denies confusion or difficulty comprehending speech. His sentences have had normal grammar (he speaks Spanish), but with slurring. He has no prior history of stroke. Onset of the symptoms was sudden. Patient was not administered IV t-PA. 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    Assessment / Plan / Recommendation Clinical Impression  Pt demonstrates no signs of aspiration with regular diet and thin liquids. He reported some difficulty initiating a swallow yesterday and some coughing with thin liquids, but tolerated his am meal without any trouble. SLP administered 3 oz taken consecutively via straw x2 with no immediate or delayed signs of aspiration. Appears that mild dysphagia has resolved. Recommend pt continue regular diet and thin liquids. No SLP f/u needed.     Aspiration Risk  Mild aspiration risk    Diet Recommendation Regular;Thin liquid   Liquid Administration via: Straw;Cup Medication Administration: Whole meds with liquid Supervision: Patient able to self feed Postural Changes: Seated upright at 90 degrees    Other  Recommendations     Follow up Recommendations        Frequency and Duration            Prognosis        Swallow Study   General HPI: Steve Callahan an 56 y.o.malewho presented to the ED with 2 day history of dizziness, slurred speech,  right facial droop and numbness along the right side of his body. Denies confusion or difficulty comprehending speech. His sentences have had normal grammar (he speaks Spanish), but with slurring. He has no prior history of stroke. Onset of the symptoms was sudden. Patient was not administered IV t-PA. 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  Type of Study: Bedside Swallow Evaluation Previous Swallow Assessment: none Diet Prior to this Study: Regular;Thin liquids Temperature Spikes Noted: No Respiratory Status: Room air History of Recent Intubation: No Behavior/Cognition: Alert;Cooperative;Pleasant mood Oral Cavity Assessment: Within Functional Limits Oral Care Completed by SLP: No Oral Cavity - Dentition: Adequate natural dentition Vision: Functional for self-feeding Self-Feeding Abilities: Able to feed self Patient Positioning: Upright in bed Baseline Vocal Quality: Normal Volitional Cough: Strong Volitional Swallow: Able to elicit    Oral/Motor/Sensory Function Overall Oral Motor/Sensory Function: Other (comment) (no significant lingual weakness recognized on exam. )   Ice Chips     Thin Liquid Thin Liquid: Within functional limits Presentation: Cup;Straw;Self Fed    Nectar Thick Nectar Thick Liquid: Not tested   Honey Thick Honey Thick Liquid: Not tested   Puree Puree: Within functional limits   Solid   GO   Solid: Within functional limits Presentation: Self Fed       Steve DittyBonnie Caison Hearn, MA CCC-SLP [phone]  Steve Callahan, Steve Callahan 05/03/2016,11:44 AM

## 2016-05-03 NOTE — Progress Notes (Signed)
Family Medicine Teaching Service Daily Progress Note Intern Pager: [phone]  Patient name: Steve Callahan Medical record number: [id number] Date of birth: [birth year] Age: 56 y.o. Gender: male  Primary Care Provider: No primary care provider on file. Consultants: neuro Code Status: full  Pt Overview and Major Events to Date:  2/11 - admit for Stroke-like symptoms  Assessment and Plan: Steve Callahan is a 56 y.o. male presenting with R sided weakness and facial droop, concerning for stroke. PMH is significant for none. Spanish speaking.   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 No large vessel occlusion or proximal stenosis on CTA head/neck - Neurology consulting -- appreciate recommendations - risk stratification labs -- A1c 5.5, lipid panel - LDL 92, TSH wnl - ECHO with EF 60-65%, no wall motion abnormality, no PFO - ASA 81mg  daily - monitor on tele - Frequent Neuro checks - PT/OT consults --> outpatient PT/OT - SLP eval as patient reports some difficulty swallowing; on schedule for today - Started lipitor 40 mg, baseline CK 103 - Family requesting copy of MRI results prior to d/c  Access to Medical Care: - Inquire further about barriers to medical care - Consult SW/CM as appropriate - [facility] as PCP at discharge  FEN/GI: Heart healthy diet, SLIV Prophylaxis: Lovenox  Disposition: pending completion of stroke workup  Subjective:  Feeling well. Thinks smile is stronger. No more choking with meals, but patient and wife would like SLP evaluation prior to d/c.   Objective: Temp:  [97 F (36.1 C)-98.7 F (37.1 C)] 98 F (36.7 C) (02/13 0800) Pulse Rate:  [56-66] 57 (02/13 0800) Resp:  [18-20] 18 (02/13 0800) BP: (98-119)/(61-73) 105/69 (02/13 0800) SpO2:  [97 %-99 %] 97 % (02/13 0800) Physical Exam: General: Alert, well-developed male in NAD Eyes: PERRLA, EOMI ENTM: Oropharynx normal, MMM Cardiovascular: RRR, S1, S2, no m/r/g Respiratory:  CTAB, no increased WOB Gastrointestinal: +BS, soft, NT, ND MSK: 5-/5 strength of RUE and RLE compared to L side. Normal tone.  Neuro: CNII-XII intact except for CNVII with R lop-sided smile. Gait normal Psych: Normal mood and affect.   Laboratory:  Recent Labs Lab 05/01/16 1728 05/01/16 1750  WBC 7.5  --   HGB 14.7 14.6  HCT 43.5 43.0  PLT 309  --     Recent Labs Lab 05/01/16 1728 05/01/16 1750  NA 139 141  K 3.6 3.5  CL 106 106  CO2 23  --   BUN 15 18  CREATININE 0.90 0.80  CALCIUM 9.6  --   PROT 6.5  --   BILITOT 0.4  --   ALKPHOS 75  --   ALT 19  --   AST 23  --   GLUCOSE 106* 105*     Imaging/Diagnostic Tests: Ct Angio Head W Or Wo Contrast  Result Date: 05/02/2016 CLINICAL DATA:  Dizziness that started yesterday. Slurred speech and right facial droop. EXAM: CT ANGIOGRAPHY HEAD AND NECK TECHNIQUE: Multidetector CT imaging of the head and neck was performed using the standard protocol during bolus administration of intravenous contrast. Multiplanar CT image reconstructions and MIPs were obtained to evaluate the vascular anatomy. Carotid stenosis measurements (when applicable) are obtained utilizing NASCET criteria, using the distal internal carotid diameter as the denominator. CONTRAST:  50 cc Isovue 370 intravenous COMPARISON:  None. FINDINGS: CTA NECK FINDINGS Aortic arch: Unremarkable.  Three vessel branching. Right carotid system: Smooth and widely patent. No atheromatous changes. Left carotid system: Smooth and widely  patent. No atheromatous changes. Vertebral arteries: Smooth and widely patent. No atheromatous changes. Negative proximal subclavian arteries. Skeleton: No acute or aggressive finding. Other neck: No significant incidental finding. Upper chest: Negative Review of the MIP images confirms the above findings CTA HEAD FINDINGS Limited by venous contamination. Anterior circulation: Small if any anterior communicating artery. Posterior communicating arteries  bilaterally. No major branch occlusion or flow limiting stenosis. No beading or aneurysm. 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 Possible moderate stenosis of the right P1 segment, although limited by small vessel size. Major branch occlusion. Negative for aneurysm or beading. Venous sinuses: Patent Anatomic variants: As above Delayed phase: Left paramedian pontine infarct is noted. No acute hemorrhage. Review of the MIP images confirms the above findings IMPRESSION: 1. Acute left pontine infarct. 2. No emergent large vessel occlusion or proximal stenosis. No definite atheromatous changes. 3. 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 Electronically Signed   By: Marnee Spring M.D.   On: 05/02/2016 09:35   Ct Head Wo Contrast  Result Date: 05/01/2016 CLINICAL DATA:  Right arm weakness and slurred speech. Facial droop. EXAM: CT HEAD WITHOUT CONTRAST TECHNIQUE: Contiguous axial images were obtained from the base of the skull through the vertex without intravenous contrast. COMPARISON:  None. FINDINGS: Brain: No evidence of acute infarction, hemorrhage, hydrocephalus, extra-axial collection or mass lesion/mass effect. Vascular: No hyperdense vessel or unexpected calcification. Skull: Normal. Negative for fracture or focal lesion. Sinuses/Orbits: No acute finding. Other: None. IMPRESSION: No acute intracranial abnormalities. Electronically Signed   By: Gerome Sam III M.D   On: 05/01/2016 18:25   Ct Angio Neck W Or Wo Contrast  Result Date: 05/02/2016 CLINICAL DATA:  Dizziness that started yesterday. Slurred speech and right facial droop. EXAM: CT ANGIOGRAPHY HEAD AND NECK TECHNIQUE: Multidetector CT imaging of the head and neck was performed using the standard protocol during bolus administration of intravenous contrast. Multiplanar  CT image reconstructions and MIPs were obtained to evaluate the vascular anatomy. Carotid stenosis measurements (when applicable) are obtained utilizing NASCET criteria, using the distal internal carotid diameter as the denominator. CONTRAST:  50 cc Isovue 370 intravenous COMPARISON:  None. FINDINGS: CTA NECK FINDINGS Aortic arch: Unremarkable.  Three vessel branching. Right carotid system: Smooth and widely patent. No atheromatous changes. Left carotid system: Smooth and widely patent. No atheromatous changes. Vertebral arteries: Smooth and widely patent. No atheromatous changes. Negative proximal subclavian arteries. Skeleton: No acute or aggressive finding. Other neck: No significant incidental finding. Upper chest: Negative Review of the MIP images confirms the above findings CTA HEAD FINDINGS Limited by venous contamination. Anterior circulation: Small if any anterior communicating artery. Posterior communicating arteries bilaterally. No major branch occlusion or flow limiting stenosis. No beading or aneurysm. 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 Possible moderate stenosis of the right P1 segment, although limited by small vessel size. Major branch occlusion. Negative for aneurysm or beading. Venous sinuses: Patent Anatomic variants: As above Delayed phase: Left paramedian pontine infarct is noted. No acute hemorrhage. Review of the MIP images confirms the above findings IMPRESSION: 1. Acute left pontine infarct. 2. No emergent large vessel occlusion or proximal stenosis. No definite atheromatous changes. 3. 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 Electronically Signed   By: Kathrynn Ducking.D.  On: 05/02/2016 09:35   Mr Brain Wo Contrast  Result Date: 05/02/2016 CLINICAL DATA:  Stroke. Two day history of dizziness and slurred  speech and right facial droop. Right body numbness. EXAM: MRI HEAD WITHOUT CONTRAST TECHNIQUE: Multiplanar, multiecho pulse sequences of the brain and surrounding structures were obtained without intravenous contrast. COMPARISON:  CT 05/02/2016 FINDINGS: Brain: Acute infarct left paramedian pons measuring approximately 10 x 20 mm. This shows restricted diffusion. No other acute infarct is identified Ventricle size normal. No significant chronic ischemia. Negative for hemorrhage or mass. Vascular: Normal arterial flow void. 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 Skull and upper cervical spine: Negative Sinuses/Orbits: Mild mucosal edema paranasal sinuses. No orbital lesion. Other: None IMPRESSION: 10 x 20 mm acute infarct left paramedian pons. No significant chronic ischemia. Mild mucosal edema paranasal sinuses. Electronically Signed   By: Marlan Palauharles  Clark M.D.   On: 05/02/2016 10:42   Hillary Percell BostonMoen Fitzgerald, MD 05/03/2016, 9:50 AM PGY-2, [facility] FPTS Intern pager: [phone], text pages welcome

## 2016-05-03 NOTE — Progress Notes (Signed)
Patient given discharge instructions.  Family at bedside.  All questions and answers addressed.

## 2016-05-03 NOTE — Progress Notes (Addendum)
Occupational Therapy Treatment/Discharge Patient Details Name: Steve Callahan MRN: [id number] DOB: 05/02/1960 Today's Date: 05/03/2016    History of present illness Pt is a 56 y/o male who presents with R sided weakness/numbness, and facial droop. 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   OT comments  Pt demonstrates good progress with OT session. Spanish interpreter, Ashby DawesGraciela, provided interpreting services during session. Pt and family educated concerning R UE strengthening and fine motor coordination HEP and they demonstrate understanding. Pt reports that numbness is decreased in R UE and is limited to R scapular region at this time.  Targeted R scapular and shoulder girdle muscles due to reports of numbness and weakness in this area. Provided handout with exercises and schedule. Pt able to complete functional mobility in his room independently. No further acute OT needs identified. Acute OT will sign off. Continue to recommend outpatient OT services.    Follow Up Recommendations  Outpatient OT;Supervision - Intermittent    Equipment Recommendations  None recommended by OT    Recommendations for Other Services      Precautions / Restrictions Precautions Precautions: Fall Restrictions Weight Bearing Restrictions: No       Mobility Bed Mobility Overal bed mobility: Independent                Transfers Overall transfer level: Modified independent                    Balance Overall balance assessment: Needs assistance Sitting-balance support: No upper extremity supported;Feet supported Sitting balance-Leahy Scale: Good Sitting balance - Comments: Able to sit at EOB for HEP education without back support.   Standing balance support: No upper extremity supported;During functional activity Standing balance-Leahy Scale: Good Standing balance comment: Walking around room safely with no LOB.                    ADL                                         General ADL Comments: Session focused on HEP for R UE coordination and strength for improved ability to utilize R hand during work related tasks with fine motor components.      Vision                     Perception     Praxis      Cognition   Behavior During Therapy: [facility]FL for tasks assessed/performed Overall Cognitive Status: Within Functional Limits for tasks assessed                  General Comments: Pt improved in appropriateness of responses as well as speed of speaking.     Extremity/Trunk Assessment               Exercises General Exercises - Upper Extremity Shoulder Flexion: Strengthening;10 reps;Right;Seated (isometric) Shoulder Extension: Strengthening;10 reps;Seated (isometric) Shoulder ABduction: Strengthening;Right;10 reps;Seated (isometric) Digit Composite Flexion: AROM;Right;10 reps;Seated Hand Exercises Digit Lifts: AROM;Right;10 reps  Shoulder shrugs: AROM; Right; 10 Reps; Seated Scapular retraction: AROM; Right: 10 Reps: Seated   Shoulder Instructions       General Comments      Pertinent Vitals/ Pain       Pain Assessment: No/denies pain  Home Living  Prior Functioning/Environment              Frequency  Min 2X/week        Progress Toward Goals  OT Goals(current goals can now be found in the care plan section)  Progress towards OT goals: Progressing toward goals  Acute Rehab OT Goals Patient Stated Goal: Return home/to PLOF OT Goal Formulation: With patient Time For Goal Achievement: 05/16/16 Potential to Achieve Goals: Good ADL Goals Pt Will Perform Grooming: with modified independence;standing Pt Will Perform Tub/Shower Transfer: with modified independence;Tub transfer;ambulating;shower seat Additional ADL Goal #1: Pt will independently complete fine motor coordination HEP in order  to improve independence with ADL and work tasks.  Plan Discharge plan remains appropriate    Co-evaluation                 End of Session     Activity Tolerance Patient tolerated treatment well   Patient Left in bed;with call bell/phone within reach;with family/visitor present   Nurse Communication Mobility status;Other (comment) (OT complete)        Time: 1438-1500 OT Time Calculation (min): 22 min  Charges: OT General Charges $OT Visit: 1 Procedure OT Treatments $Therapeutic Exercise: 8-22 mins   Doristine Section, MS OTR/L  Pager: [phone]  Holton Sidman A Anajah Sterbenz 05/03/2016, 4:01 PM

## 2016-05-03 NOTE — Progress Notes (Signed)
PT Cancellation Note  Patient Details Name: Steve Callahan MRN: [id number] DOB: 04/23/1960   Cancelled Treatment:    Reason Eval/Treat Not Completed: Patient declined, no reason specified;Other (comment) (Pt in process of discharging from hospital-pt hasno concerns) Pt declined need to practice stairs.  He also reports he has exercises as provided by the OT.  Family agrees that they will provide assist upon discharge.  Steve Callahan, PT [phone]  Steve Callahan 05/03/2016, 3:58 PM

## 2016-05-03 NOTE — Progress Notes (Signed)
Family Medicine Teaching Service MEDICAL STUDENT Daily Progress Note For full and approved plan, please see resident's attested note Intern Pager: [phone]  Patient name: Steve Callahan Medical record number: [id number] Date of birth: 08/10/1960 Age: 56 y.o. Gender: male  Primary Care Provider: No primary care provider on file. Consultants: Neurology Code Status: Full  Assessment and Plan: Wilford SportsJuan Floresis a 56 y.o.Spanish speaking malewith no significant PMH presenting with acute onset dizziness, R sided weakness/numbness, slurred speech, and right facial droop concerning for stroke found on MRI brain to have acute infarct of the left paramedian pons.  #Left paramedian pontine infarct: stable. Per Neuro, likely causes are small vessel disease vs diminutive posterior circulation as pt does not have traditional risk factors. Pt feeling well, with no further difficulty swallowing overnight, though ongoing concern for difficulty swallowing with 2-3 choking episodes 2/12. VSS and exam unchanged today. Labs significant for negative UDS, A1c 5.5%. Echo 05/02/16 significant for mild-moderate calcification of aortic annulus and mildly calcified aortic leaflets. Neuro signed off, appreciate their recs. PT and OT consults recommended as outpatient. SLP recommends regular diet and thin liquids. - ASA 81 mg daily - Atorvastatin 40 mg daily - Telemetry - q4h Neuro checks - PT/OT following acutely - [facility] per Neuro note  #Access to Medical Care: Patient [place] in [place], [place]. He does not have a PCP. [facility] - Dr. Dani GobbleHillary Fitzgerald will be pt's PCP - Follow up scheduled with Dr. Mickie HillierIan McKeag 05/06/16 - Consult Social Work to help patient with any insurance options available  FEN/GI: Heart healthy diet Prophylaxis: Lovenox  Disposition:Inpatient - Likely discharge  later today  Subjective:  Pt reports that he slept well and that he hasn't had any more symptoms that are related to swallowing difficulty. Discussed findings of echo obtained yesterday.  Objective: Temp:  [97 F (36.1 C)-98.7 F (37.1 C)] 98.7 F (37.1 C) (02/13 0640) Pulse Rate:  [56-66] 57 (02/13 0640) Resp:  [18-20] 18 (02/13 0640) BP: (98-119)/(54-73) 98/61 (02/13 0640) SpO2:  [98 %-99 %] 99 % (02/13 0640)  Physical Exam: General: Pleasant, well-groomed Spanish speaking man, appears stated age, sitting upright in bed with empty breakfast tray, no apparent distress. Cardiovascular: Regular rhythm, normal rate. Distant heart sounds with no murmurs rubs or gallops. Radial pulse 2+ bilaterally. Respiratory: Clear to auscultation bilaterally, no wheezes/rales/rhonci. No increased work of breathing. Neuro: Right sided facial droop unchanged, holding right arm stiffly at his side, strength 4-5/5 in RUE, distal 5/5.  Laboratory:  Recent Labs Lab 05/01/16 1728 05/01/16 1750  WBC 7.5  --   HGB 14.7 14.6  HCT 43.5 43.0  PLT 309  --     Recent Labs Lab 05/01/16 1728 05/01/16 1750  NA 139 141  K 3.6 3.5  CL 106 106  CO2 23  --   BUN 15 18  CREATININE 0.90 0.80  CALCIUM 9.6  --   PROT 6.5  --   BILITOT 0.4  --   ALKPHOS 75  --   ALT 19  --   AST 23  --   GLUCOSE 106* 105*   - HbA1c 5.5%  Imaging/Diagnostic Tests: Echocardiogram 05/02/2016 Study Conclusions - Left ventricle: The cavity size was normal. Systolic function was   normal. The estimated ejection fraction was in the range of 60%   to 65%. Wall motion was normal; there  were no regional wall   motion abnormalities. - Aortic valve: Mildly to moderately calcified annulus. Trileaflet;   normal thickness, mildly calcified leaflets. - Mitral valve: There was trivial regurgitation.  Biagio Quint, Medical Student 05/03/2016, 7:49 AM FPTS Intern pager: [phone], text pages welcome

## 2016-05-03 NOTE — Care Management Note (Signed)
Case Management Note  Patient Details  Name: Steve Callahan MRN: [id number] Date of Birth: 08/21/1960  Subjective/Objective:                    Action/Plan: Pt discharging home with self care. Pt without insurance and no PCP. [facility] Pt d/cing on asa and lipitor. Pt provided coupon for the lipitor and the family states it is affordable with the coupon.  Pt with recommendations for outpatient therapy. CM spoke to MD and they are interested in pt having outpatient therapy. CM spoke to patients family and they would like him to have an evaluation visit and potentially will private pay or make payments if patient will continue to need therapy. Pt has transportation home.   Expected Discharge Date:                  Expected Discharge Plan:  Home/Self Care  In-House Referral:     Discharge planning Services  CM Consult  Post Acute Care Choice:    Choice offered to:     DME Arranged:    DME Agency:     HH Arranged:    HH Agency:     Status of Service:  Completed, signed off  If discussed at [employer] Length of Stay Meetings, dates discussed:    Additional Comments:  Kermit BaloKelli F Eathon Valade, RN 05/03/2016, 1:54 PM

## 2016-05-03 NOTE — Discharge Summary (Signed)
[facility] Discharge Summary  Patient name: Steve Callahan Medical record number: [id number] Date of birth: 16-Aug-1960 Age: 56 y.o. Gender: male Date of Admission: 05/01/2016  Date of Discharge: 05/03/2016 Admitting Physician: Blane Ohara McDiarmid, MD  Primary Care Provider: No primary care provider on file. Consultants: Neurology  Indication for Hospitalization: Stroke  Discharge Diagnoses/Problem List:  Active Problems:   Stroke    Stroke-like symptoms   Right sided weakness   Spanish speaking patient  Disposition: Home with outpatient PT/OT referrals  Discharge Condition: Improved  Discharge Exam:  Blood pressure 99/63, pulse 66, temperature 98.1 F (36.7 C), temperature source Oral, resp. rate 18, height 5' 6"  (1.676 m), weight 178 lb 2.1 oz (80.8 kg), SpO2 99 %. Physical Exam: General: Alert, well-developed male in NAD Eyes: PERRLA, EOMI ENTM: Oropharynx normal, MMM Cardiovascular: RRR, S1, S2, no m/r/g Respiratory: CTAB, no increased WOB Gastrointestinal: +BS, soft, NT, ND MSK: 5-/5 strength of RUE and RLE compared to L side. Normal tone.  Neuro: CNII-XII intact except for CNVII with R lop-sided smile. Gait normal Psych: Normal mood and affect.   Brief Hospital Course:  Janice Seales a 56 y.o.malewho presented with R sided weakness and facial droop. He was found to have a left pontine stroke. PMH is significant for none.   Left paramedian pontine infarct: Patient presented with 2-day history of slurred speech, weakness of RUE and some dizziness. 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 No large vessel occlusion or proximal stenosis on CTA head/neck. Risk stratification labs showed A1c 5.5, LDL 92, and TSH wnl. ECHO with EF 60-65%, no wall motion abnormality, no PFO. Patient does not have history of hypertension, no known family history of stroke/CAD, and is a  never smoker. Neurology considered small vessel disease and/or diminutive posterior circulation could be a cause.   He was started on lipitor and aspirin. He had initially complained of some swallowing difficulties but felt these had improved by time of discharge and was cleared by SLP for a normal diet. PT and OT recommended outpatient therapy. Patient does not have insurance but plans to apply for Medicaid/[employer] Card. Case Management met with patient prior to discharge, and he felt he could afford his medications with coupon provided. OT provided some exercises with a schedule for patient to follow at home, as well.   Issues for Follow Up:  1. See if patient has questions about signing up for [facility] Card/Medicaid/or interest in meeting with [facility] Social Work  2. Ask if any further improvement of weakness (Please see Neurology note from 2/12 for most comprehensive exam).  3. Ask if he has had any trouble picking up his medications  Significant Procedures: None  Significant Labs and Imaging:   Recent Labs Lab 05/01/16 1728 05/01/16 1750  WBC 7.5  --   HGB 14.7 14.6  HCT 43.5 43.0  PLT 309  --     Recent Labs Lab 05/01/16 1728 05/01/16 1750  NA 139 141  K 3.6 3.5  CL 106 106  CO2 23  --   GLUCOSE 106* 105*  BUN 15 18  CREATININE 0.90 0.80  CALCIUM 9.6  --   ALKPHOS 75  --   AST 23  --   ALT 19  --   ALBUMIN 3.9  --     Results/Tests Pending at Time of Discharge: None  Discharge Medications:  Allergies as of  05/03/2016   No Known Allergies     Medication List    TAKE these medications   aspirin 81 MG EC tablet Take 1 tablet (81 mg total) by mouth daily. Start taking on:  05/04/2016   atorvastatin 40 MG tablet Commonly known as:  LIPITOR Take 1 tablet (40 mg total) by mouth daily at 6 PM.       Discharge Instructions: Please refer to Patient Instructions section of EMR for full details.  Patient was counseled important signs and symptoms that should prompt  return to medical care, changes in medications, dietary instructions, activity restrictions, and follow up appointments.   Follow-Up Appointments: Follow-up Information    Georges Lynch, MD. Go on 05/06/2016.   Specialty:  Family Medicine Why:  Appointment at 11:15am.  Please arrive 15 minutes early. Contact information: [address] [address] [phone]        Dennie Bible, NP. Schedule an appointment as soon as possible for a visit in 6 week(s).   Specialty:  Family Medicine Contact information: [address] [place] [address] [place], MD 05/03/2016, 12:24 PM PGY-2, [place]

## 2016-05-06 ENCOUNTER — Inpatient Hospital Stay: Payer: Self-pay | Admitting: Family Medicine

## 2016-05-09 ENCOUNTER — Ambulatory Visit (INDEPENDENT_AMBULATORY_CARE_PROVIDER_SITE_OTHER): Payer: Self-pay | Admitting: Family Medicine

## 2016-05-09 ENCOUNTER — Encounter: Payer: Self-pay | Admitting: Family Medicine

## 2016-05-09 DIAGNOSIS — I6302 Cerebral infarction due to thrombosis of basilar artery: Secondary | ICD-10-CM

## 2016-05-09 DIAGNOSIS — Z789 Other specified health status: Secondary | ICD-10-CM

## 2016-05-09 NOTE — Assessment & Plan Note (Signed)
Patient is Spanish-speaking and does not currently have insurance. I spent a significant amount of time discussing the need for Medicaid or the orange card. Patient states that he has paperwork coming for obtaining the orange card. He stated his understanding that if this paperwork did not arrive by the end of this week he is to contact our office for further help getting this program established for his care.

## 2016-05-09 NOTE — Assessment & Plan Note (Signed)
Improving: Patient is here to establish care after suffering a stroke earlier this month. He endorses good compliance with his statin and aspirin at this time. He feels his symptoms have gradually been improving since discharge from the hospital. He is still waiting for an appointment with neurology. - Continue atorvastatin - Continue aspirin - Contact neurology appointment is not set up by the end of the month. - Follow-up in 3 months

## 2016-05-09 NOTE — Patient Instructions (Signed)
It was a pleasure seeing you today in our clinic. Today we discussed establishing care and your hospital follow-up. Here is the treatment plan we have discussed and agreed upon together:   - Continue taking the atorvastatin once every day. - Continue taking one baby aspirin (81 mg) every day. - It is incredibly important that you get set up with the orange card. I will discuss this with our front staff and try to get you set up as soon as possible. - I would like to see you back in 3 months to follow your progress.

## 2016-05-09 NOTE — Progress Notes (Signed)
   HPI  CC: Hospital follow-up and establishing care Patient is here to establish care in our clinic after suffering a CVA earlier this month. He states that he has not been seen by a regular physician in decades. Prior to his hospital stay he had been on no medications. He has some persistent symptoms since discharge, as he feels his voice is not quite back to baseline, his smile he feels is not quite symmetric, and the strength in his right arm is not fully back.  Patient endorses good compliance with his medications. He states he has had no setbacks and no known side effects from his atorvastatin or daily aspirin. Patient has not yet followed up with neurology as he was told he had to "reschedule" his appointment for reasons unknown to him at this time.  Overall: Patient feels improved/better than he felt at discharge from the hospital.  ROS: He denies any dizziness, vertigo, lightheadedness, headache, fever, chills, dysphagia, cough, shortness of breath, chest pain, palpitations, nausea, vomiting, diarrhea, diaphoresis, or paresthesias.  CC, SH/smoking status, and VS noted  Objective: BP 100/75 (BP Location: Left Arm, Patient Position: Sitting, Cuff Size: Normal)   Temp 98 F (36.7 C) (Oral)   Ht 5\' 4"  (1.626 m)   Wt 177 lb 6.4 oz (80.5 kg)   SpO2 98%   BMI 30.45 kg/m  Gen: NAD, alert, cooperative, and pleasant. HEENT: NCAT, EOMI, PERRL, OP clear, TMs clear CV: RRR, no murmur Resp: CTAB, no wheezes, non-labored Abd: SNTND, BS present, no guarding or organomegaly Ext: No edema, warm Neuro: Alert and oriented, Speech clear, CN II-XII intact. Strength 5/5 left arm and -5/5 right arm (grip), sensation intact throughout  Assessment and plan:  Cerebral infarction [facility](HCC) Improving: Patient is here to establish care after suffering a stroke earlier this month. He endorses good compliance with his statin and aspirin at this time. He feels his symptoms have gradually been improving since  discharge from the hospital. He is still waiting for an appointment with neurology. - Continue atorvastatin - Continue aspirin - Contact neurology appointment is not set up by the end of the month. - Follow-up in 3 months  Language barrier affecting health care Patient is Spanish-speaking and does not currently have insurance. I spent a significant amount of time discussing the need for Medicaid or the orange card. Patient states that he has paperwork coming for obtaining the orange card. He stated his understanding that if this paperwork did not arrive by the end of this week he is to contact our office for further help getting this program established for his care.   Kathee DeltonIan D Lorie Melichar, MD,MS,  PGY3 05/09/2016 3:26 PM

## 2016-05-13 ENCOUNTER — Telehealth: Payer: Self-pay | Admitting: Family Medicine

## 2016-05-13 NOTE — Telephone Encounter (Signed)
Pt is calling and would like to know when they can return to work. He feels like he can go back, but does need doctors clearance for his employer. Please call and discuss with the patient. jw

## 2016-05-16 NOTE — Telephone Encounter (Signed)
As discussed in clinic, Patient needs to be cleared by neurology before I am willing to clear him, ONLY because his job requires heavy lifting.   I am willing to clear him for work today with a 5 lb. Weight restriction (also discussed in our visit). But according to him in our office visit, that clearance was not useful.

## 2016-05-17 NOTE — Telephone Encounter (Signed)
Patient and son informed of message from MD yesterday by Annice PihJackie, expressed understanding.

## 2016-06-27 ENCOUNTER — Encounter: Payer: Self-pay | Admitting: Nurse Practitioner

## 2016-06-27 ENCOUNTER — Ambulatory Visit (INDEPENDENT_AMBULATORY_CARE_PROVIDER_SITE_OTHER): Payer: Self-pay | Admitting: Nurse Practitioner

## 2016-06-27 ENCOUNTER — Encounter (INDEPENDENT_AMBULATORY_CARE_PROVIDER_SITE_OTHER): Payer: Self-pay

## 2016-06-27 ENCOUNTER — Encounter: Payer: Self-pay | Admitting: *Deleted

## 2016-06-27 VITALS — BP 120/75 | HR 67 | Ht 66.0 in | Wt 184.2 lb

## 2016-06-27 DIAGNOSIS — I6302 Cerebral infarction due to thrombosis of basilar artery: Secondary | ICD-10-CM

## 2016-06-27 DIAGNOSIS — E785 Hyperlipidemia, unspecified: Secondary | ICD-10-CM | POA: Insufficient documentation

## 2016-06-27 DIAGNOSIS — Z789 Other specified health status: Secondary | ICD-10-CM

## 2016-06-27 NOTE — Progress Notes (Signed)
I agree with the assessment and plan as directed by NP .The patient is not known to me but I was available for consultation .   Rahmel Nedved, MD  

## 2016-06-27 NOTE — Progress Notes (Signed)
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 I had to call the language line to communicate with patient and family. Family speaks Spanish. Patient was admitted to the hospital in February after a 2 day history of dizziness and slurred speech and numbness along the right side of the body he did have some slurring of his speech. No prior history of stroke. Patient was not administered IV TPA due to timeframes MRI of the brain showed left pontine infarct. 2-D echo 60-65%. No PFO. LDL 92 hemoglobin A1c 5.5. He returns to the clinic today for hospital follow-up. He is currently on aspirin for secondary stroke prevention without recurrent stroke or TIA symptoms. In addition he has no bruising or bleeding. He is also on Lipitor for hyperlipidemia. He is to have his lipids checked on April 19 at [facility] in [place]. Dr. Royal Hawthorn will follow him at [facility]. Blood pressure in the office today 120/75 .He has been given a muscle relaxant he does not know the name of this He returns for reevaluation   REVIEW OF SYSTEMS: Full 14 system review of systems performed and notable only for those listed, all others are neg:  Constitutional: Activity change Cardiovascular: neg Ear/Nose/Throat: neg  Skin: neg Eyes: neg Respiratory: neg Gastroitestinal: neg  Hematology/Lymphatic: neg  Endocrine: neg Musculoskeletal: Muscle cramps Allergy/Immunology: neg Neurological: neg Psychiatric: Depression and anxiety Sleep : Insomnia   ALLERGIES: No Known Allergies  HOME MEDICATIONS: Outpatient Medications Prior to Visit  Medication Sig Dispense Refill  . aspirin EC 81 MG EC tablet Take 1 tablet (81 mg total) by mouth daily. 30 tablet 1  .  atorvastatin (LIPITOR) 40 MG tablet Take 1 tablet (40 mg total) by mouth daily at 6 PM. 30 tablet 1   No facility-administered medications prior to visit.     PAST MEDICAL HISTORY: Past Medical History:  Diagnosis Date  . Stroke [facility])     PAST SURGICAL HISTORY: History reviewed. No pertinent surgical history.  FAMILY HISTORY: History reviewed. No pertinent family history.  SOCIAL HISTORY: Social History   Social History  . Marital status: Married    Spouse name: Steve Callahan  . Number of children: 5  . Years of education: 8   Occupational History  .      not working at [employer] time 06/27/16   Social History Main Topics  . Smoking status: Never Smoker  . Smokeless tobacco: Never Used  . Alcohol use No  . Drug use: No  . Sexual activity: Not on file   Other Topics Concern  . Not on file   Social History Narrative  . No narrative on file     PHYSICAL EXAM  Vitals:   06/27/16 1055  BP: 120/75  Pulse: 67  Weight: 184 lb 3.2 oz (83.6 kg)  Height:  (1.676 m)   Body mass index is 29.73 kg/m.  Generalized: Well developed, in no acute distress  Head: normocephalic and atraumatic,. Oropharynx benign  Neck: Supple, no carotid bruits  Cardiac: Regular rate rhythm, no murmur  Musculoskeletal: No deformity   Neurological examination   Mentation: Alert oriented to time, place, history taking. Attention span and concentration appropriate. Recent and remote memory intact.  Follows all commands Through language interpreter on the  language line .Marland Kitchen   Cranial nerve II-XII: Pupils were equal round reactive to light extraocular movements were full, visual field were full on confrontational test. Facial sensation and strength were normal. hearing was intact to finger rubbing bilaterally. Uvula tongue midline. head turning and shoulder shrug were normal and symmetric.Tongue protrusion into cheek strength was normal. Motor: normal bulk and tone, full strength in the BUE, BLE, fine  finger movements normal, no pronator drift. No focal weakness Sensory: normal and symmetric to light touch, pinprick and vibratory in the upper and lower extremities  Coordination: finger-nose-finger, heel-to-shin bilaterally, no dysmetria Reflexes: 1+ upper lower and symmetric, plantar responses were flexor bilaterally. Gait and Station: Rising up from seated position without assistance, normal stance,  moderate stride, good arm swing, smooth turning, able to perform tiptoe, and heel walking without difficulty. Tandem gait is steady  DIAGNOSTIC DATA (LABS, IMAGING, TESTING) - I reviewed patient records, labs, notes, testing and imaging myself where available.  Lab Results  Component Value Date   WBC 7.5 05/01/2016   HGB 14.6 05/01/2016   HCT 43.0 05/01/2016   MCV 85.3 05/01/2016   PLT 309 05/01/2016      Component Value Date/Time   NA 141 05/01/2016 1750   K 3.5 05/01/2016 1750   CL 106 05/01/2016 1750   CO2 23 05/01/2016 1728   GLUCOSE 105 (H) 05/01/2016 1750   BUN 18 05/01/2016 1750   CREATININE 0.80 05/01/2016 1750   CALCIUM 9.6 05/01/2016 1728   PROT 6.5 05/01/2016 1728   ALBUMIN 3.9 05/01/2016 1728   AST 23 05/01/2016 1728   ALT 19 05/01/2016 1728   ALKPHOS 75 05/01/2016 1728   BILITOT 0.4 05/01/2016 1728   GFRNONAA >60 05/01/2016 1728   GFRAA >60 05/01/2016 1728   Lab Results  Component Value Date   CHOL 161 05/02/2016   HDL 37 (L) 05/02/2016   LDLCALC 92 05/02/2016   TRIG 159 (H) 05/02/2016   CHOLHDL 4.4 05/02/2016   Lab Results  Component Value Date   HGBA1C 5.5 05/02/2016   No results found for: [id number] Lab Results  Component Value Date   TSH 2.363 05/02/2016      ASSESSMENT AND PLAN  56 y.o. year old male  has a past medical history of Stroke (HCC). 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 Patient does not have a history of hypertension diabetes smoking or  obstructive sleep apnea. The patient is a current patient of Dr. Roda Shutters  who is out of the office today . This note is sent to the work in doctor.       Stressed the importance of management of risk factors to prevent further stroke Continue aspirin for secondary stroke prevention Maintain strict control of hypertension with blood pressure goal below 130/90, today's read120/75 Cholesterol with LDL cholesterol less than 70,   most recent 92  continue Lipitor Exercise by walking, slowly increase , eat healthy diet with whole grains,  fresh fruits and vegetables Discussed risk for recurrent stroke/ TIA and answered additional questions Follow up in 3 months This was a prolonged visit requiring 45 minutes and medical decision making of high complexity with extensive review of history, hospital chart, counseling and answering questions through the language line. Patient may return to work. No lifting over 50 pounds Letter written for patient. He works Armed forces technical officer. He may also get a massage for his muscle cramps. He was instructed that the medication given  by his primary care would help the muscle cramps and his insomnia if he takes it at night. Nilda Riggs, [facility], [facility], APRN  [facility] [address], [address] [place], [place] [address] [phone]

## 2016-06-27 NOTE — Patient Instructions (Signed)
Stressed the importance of management of risk factors to prevent further stroke Continue aspirin for secondary stroke prevention Maintain strict control of hypertension with blood pressure goal below 130/90, today's read120/75 Cholesterol with LDL cholesterol less than 70,   most recent 92  continue Lipitor Exercise by walking, slowly increase , eat healthy diet with whole grains,  fresh fruits and vegetables Discussed risk for recurrent stroke/ TIA and answered additional questions Follow up in 3 months

## 2016-09-30 ENCOUNTER — Ambulatory Visit: Payer: Self-pay | Admitting: Nurse Practitioner

## 2016-10-04 ENCOUNTER — Encounter: Payer: Self-pay | Admitting: Nurse Practitioner

## 2016-12-14 NOTE — Progress Notes (Signed)
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 H is currently on aspirin without further stroke or TIA symptoms. He has no bruising and no bleeding. He is supposed to be on Lipitor but ran out of the medication 1 month ago and did not get it renewed. His primary care is Dr. Jenne Pane in [place].Patient assumed he could  stop taking the medication. He was made aware that he needs to stay on  this for his cholesterol He has return to work.Blood pressure in the office today 119/78. eturns for reevaluation    HISTORY: 06/27/16 CMMr. Callahan, 56 year old male returns for follow-up with his wife and daughter. I had to call the language line to communicate with patient and family. Family speaks Spanish. Patient was admitted to the hospital in February after a 2 day history of dizziness and slurred speech and numbness along the right side of the body he did have some slurring of his speech. No prior history of stroke. Patient was not administered IV TPA due to timeframes MRI of the brain showed left pontine infarct. 2-D echo 60-65%. No PFO. LDL 92 hemoglobin A1c 5.5. He returns to the clinic today for hospital follow-up. He is currently on aspirin for secondary stroke prevention without recurrent stroke or TIA symptoms. In addition he has no bruising or bleeding. He is also on Lipitor for hyperlipidemia. He is to have his lipids checked on April 19 at [facility] in [place]. Dr. Royal Hawthorn will follow him at [facility]. Blood pressure in the office today 120/75 .He has been given a muscle relaxant he does not know the name of this He returns for reevaluation   REVIEW OF SYSTEMS: Full 14 system  review of systems performed and notable only for those listed, all others are neg:  Constitutional: Activity change Cardiovascular: neg Ear/Nose/Throat: neg  Skin: neg Eyes: neg Respiratory: neg Gastroitestinal: neg  Hematology/Lymphatic: neg  Endocrine: neg Musculoskeletal: back pain Allergy/Immunology: neg Neurological: neg Psychiatric: anxiety Sleep : Insomnia   ALLERGIES: No Known Allergies  HOME MEDICATIONS: Outpatient Medications Prior to Visit  Medication Sig Dispense Refill  . aspirin EC 81 MG EC tablet Take 1 tablet (81 mg total) by mouth daily. 30 tablet 1  . atorvastatin (LIPITOR) 40 MG tablet Take 1 tablet (40 mg total) by mouth daily at 6 PM. 30 tablet 1  . UNKNOWN TO PATIENT muscle relaxer     No facility-administered medications prior to visit.     PAST MEDICAL HISTORY: Past Medical History:  Diagnosis Date  . Stroke [facility])     PAST SURGICAL HISTORY: History reviewed. No pertinent surgical history.  FAMILY HISTORY: History reviewed. No pertinent family history.  SOCIAL HISTORY: Social History   Social History  . Marital status: Married    Spouse name: Steve Callahan  . Number of children: 5  . Years of education: 8   Occupational History  .      not working at [employer] time 06/27/16   Social History Main Topics  . Smoking status: Never Smoker  . Smokeless tobacco: Never Used  . Alcohol use No  . Drug use: No  . Sexual activity: Not on file   Other Topics Concern  . Not on file  Social History Narrative  . No narrative on file     PHYSICAL EXAM  Vitals:   12/15/16 1457  BP: 119/78  Pulse: 81  Weight: 173 lb 9.6 oz (78.7 kg)   Body mass index is 28.02 kg/m.  Generalized: Well developed, in no acute distress  Head: normocephalic and atraumatic,. Oropharynx benign  Neck: Supple, no carotid bruits  Cardiac: Regular rate rhythm, no murmur  Musculoskeletal: No deformity   Neurological examination   Mentation: Alert oriented to time,  place, history taking. Attention span and concentration appropriate. Recent and remote memory intact.  Follows all commands Through language interpreter   Cranial nerve II-XII: Pupils were equal round reactive to light extraocular movements were full, visual field were full on confrontational test. Facial sensation and strength were normal. hearing was intact to finger rubbing bilaterally. Uvula tongue midline. head turning and shoulder shrug were normal and symmetric.Tongue protrusion into cheek strength was normal. Motor: normal bulk and tone, full strength in the BUE, BLE, fine finger movements normal, no pronator drift. No focal weakness Sensory: normal and symmetric to light touch, pinprick and vibratory in the upper and lower extremities  Coordination: finger-nose-finger, heel-to-shin bilaterally, no dysmetria Reflexes: 1+ upper lower and symmetric, plantar responses were flexor bilaterally. Gait and Station: Rising up from seated position without assistance, normal stance,  moderate stride, good arm swing, smooth turning, able to perform tiptoe, and heel walking without difficulty. Tandem gait is steady  DIAGNOSTIC DATA (LABS, IMAGING, TESTING) - I reviewed patient records, labs, notes, testing and imaging myself where available.  Lab Results  Component Value Date   WBC 7.5 05/01/2016   HGB 14.6 05/01/2016   HCT 43.0 05/01/2016   MCV 85.3 05/01/2016   PLT 309 05/01/2016      Component Value Date/Time   NA 141 05/01/2016 1750   K 3.5 05/01/2016 1750   CL 106 05/01/2016 1750   CO2 23 05/01/2016 1728   GLUCOSE 105 (H) 05/01/2016 1750   BUN 18 05/01/2016 1750   CREATININE 0.80 05/01/2016 1750   CALCIUM 9.6 05/01/2016 1728   PROT 6.5 05/01/2016 1728   ALBUMIN 3.9 05/01/2016 1728   AST 23 05/01/2016 1728   ALT 19 05/01/2016 1728   ALKPHOS 75 05/01/2016 1728   BILITOT 0.4 05/01/2016 1728   GFRNONAA >60 05/01/2016 1728   GFRAA >60 05/01/2016 1728   Lab Results  Component Value  Date   CHOL 161 05/02/2016   HDL 37 (L) 05/02/2016   LDLCALC 92 05/02/2016   TRIG 159 (H) 05/02/2016   CHOLHDL 4.4 05/02/2016   Lab Results  Component Value Date   HGBA1C 5.5 05/02/2016   No results found for: [id number] Lab Results  Component Value Date   TSH 2.363 05/02/2016      ASSESSMENT AND PLAN  56 y.o. year old male  has a past medical history of Stroke (HCC). 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 Patient has a risk factor of hyperlipidemia. Patient does not have a history of hypertension diabetes smoking or obstructive sleep apnea. The patient is a current patient of Dr. Roda Shutters  who is out of the office today . This note is sent to the work in doctor.       Stressed the importance of management of risk factors to prevent further stroke Continue aspirin for secondary stroke prevention Maintain strict control of hypertension with blood pressure goal below 130/90, today's reading 119/78 Cholesterol  with LDL cholesterol less than 70,   most recent 92  continue Lipitor RX given for 4 months until seen by PCP Exercise by walking, 30 min every day  eat healthy diet with whole grains,  fresh fruits and vegetables, lean meats Follow up 6 months if stable will discharge I spent 30 minutes in total face to face time with the patient and family through the interpreter more than 50% of which was spent counseling and coordination of care, reviewing test results reviewing medications and discussing and reviewing the diagnosis of stroke and management of risk factors and importance of following up with primary care provider. Nilda Riggs, [facility], [facility], APRN  [facility] [address] [place], [place] [address] [phone]

## 2016-12-15 ENCOUNTER — Encounter: Payer: Self-pay | Admitting: Nurse Practitioner

## 2016-12-15 ENCOUNTER — Encounter (INDEPENDENT_AMBULATORY_CARE_PROVIDER_SITE_OTHER): Payer: Self-pay

## 2016-12-15 ENCOUNTER — Ambulatory Visit (INDEPENDENT_AMBULATORY_CARE_PROVIDER_SITE_OTHER): Payer: Self-pay | Admitting: Nurse Practitioner

## 2016-12-15 VITALS — BP 119/78 | HR 81 | Wt 173.6 lb

## 2016-12-15 DIAGNOSIS — I6302 Cerebral infarction due to thrombosis of basilar artery: Secondary | ICD-10-CM

## 2016-12-15 DIAGNOSIS — Z789 Other specified health status: Secondary | ICD-10-CM

## 2016-12-15 DIAGNOSIS — E785 Hyperlipidemia, unspecified: Secondary | ICD-10-CM

## 2016-12-15 MED ORDER — ATORVASTATIN CALCIUM 40 MG PO TABS: 40.0000 mg | ORAL_TABLET | Freq: Every day | ORAL | 3 refills | Status: AC

## 2016-12-15 NOTE — Patient Instructions (Signed)
Stressed the importance of management of risk factors to prevent further stroke Continue aspirin for secondary stroke prevention Maintain strict control of hypertension with blood pressure goal below 130/90, today's reading 119/78 Cholesterol with LDL cholesterol less than 70,   most recent 92  continue Lipitor RX for 4 months until seen by PCP Exercise by walking, 30 min every day  eat healthy diet with whole grains,  fresh fruits and vegetables, lean meats Follow up 6 months if stable will discharge

## 2016-12-16 NOTE — Progress Notes (Signed)
I have read the note, and I agree with the clinical assessment and plan.  Tamakia Porto A. Kearstin Learn, MD, PhD, FAAN Certified in Neurology, Clinical Neurophysiology, Sleep Medicine, Pain Medicine and Neuroimaging  [facility] [address], [address] [place], [place] [address] [phone]  

## 2017-06-14 ENCOUNTER — Ambulatory Visit: Payer: Self-pay | Admitting: Nurse Practitioner

## 2017-06-27 ENCOUNTER — Emergency Department (HOSPITAL_COMMUNITY): Payer: Self-pay

## 2017-06-27 ENCOUNTER — Emergency Department (HOSPITAL_COMMUNITY)
Admission: EM | Admit: 2017-06-27 | Discharge: 2017-06-27 | Disposition: A | Discharge status: Home / Self Care | Payer: Self-pay | Attending: Emergency Medicine | Admitting: Emergency Medicine

## 2017-06-27 ENCOUNTER — Encounter (HOSPITAL_COMMUNITY): Payer: Self-pay

## 2017-06-27 ENCOUNTER — Other Ambulatory Visit: Payer: Self-pay

## 2017-06-27 DIAGNOSIS — H538 Other visual disturbances: Secondary | ICD-10-CM | POA: Insufficient documentation

## 2017-06-27 DIAGNOSIS — M79604 Pain in right leg: Secondary | ICD-10-CM | POA: Insufficient documentation

## 2017-06-27 DIAGNOSIS — R202 Paresthesia of skin: Secondary | ICD-10-CM | POA: Insufficient documentation

## 2017-06-27 DIAGNOSIS — Z7982 Long term (current) use of aspirin: Secondary | ICD-10-CM | POA: Insufficient documentation

## 2017-06-27 DIAGNOSIS — R42 Dizziness and giddiness: Secondary | ICD-10-CM | POA: Insufficient documentation

## 2017-06-27 DIAGNOSIS — Z8673 Personal history of transient ischemic attack (TIA), and cerebral infarction without residual deficits: Secondary | ICD-10-CM | POA: Insufficient documentation

## 2017-06-27 DIAGNOSIS — Z79899 Other long term (current) drug therapy: Secondary | ICD-10-CM | POA: Insufficient documentation

## 2017-06-27 HISTORY — DX: Pure hypercholesterolemia, unspecified: E78.00

## 2017-06-27 LAB — BASIC METABOLIC PANEL
ANION GAP: 8 (ref 5–15)
BUN: 8 mg/dL (ref 6–20)
CALCIUM: 9.6 mg/dL (ref 8.9–10.3)
CO2: 22 mmol/L (ref 22–32)
Chloride: 110 mmol/L (ref 101–111)
Creatinine, Ser: 0.84 mg/dL (ref 0.61–1.24)
GFR calc non Af Amer: >60 mL/min (ref >60)
Glucose, Bld: 101 mg/dL — ABNORMAL HIGH (ref 65–99)
Potassium: 3.9 mmol/L (ref 3.5–5.1)
Sodium: 140 mmol/L (ref 135–145)

## 2017-06-27 LAB — CBC
HCT: 45.3 % (ref 39.0–52.0)
Hemoglobin: 15.1 g/dL (ref 13.0–17.0)
MCH: 28.7 pg (ref 26.0–34.0)
MCHC: 33.3 g/dL (ref 30.0–36.0)
MCV: 86.1 fL (ref 78.0–100.0)
Platelets: 280 10*3/uL (ref 150–400)
RBC: 5.26 MIL/uL (ref 4.22–5.81)
RDW: 12.4 % (ref 11.5–15.5)
WBC: 5.5 10*3/uL (ref 4.0–10.5)

## 2017-06-27 LAB — I-STAT TROPONIN, ED: TROPONIN I, POC: 0 ng/mL (ref 0.00–0.08)

## 2017-06-27 MED ORDER — MECLIZINE HCL 25 MG PO TABS: 25.0000 mg | ORAL_TABLET | Freq: Three times a day (TID) | ORAL | 0 refills | Status: AC | PRN

## 2017-06-27 NOTE — ED Notes (Signed)
Family reports pt has had trouble with swallowing for the last week

## 2017-06-27 NOTE — ED Notes (Signed)
Pt given turkey sandwich and ice water ?

## 2017-06-27 NOTE — Discharge Instructions (Signed)
It was my pleasure taking care of you today!   Please keep your scheduled appointment with your neurologist. Please inform them of your visit and MRI today. I have attached results so that you can bring them to this appointment.   Return to ER for new or worsening symptoms, any additional concerns.

## 2017-06-27 NOTE — ED Notes (Addendum)
Pt verbalized understanding of discharge instructions.

## 2017-06-27 NOTE — ED Notes (Signed)
Patient transported to MRI 

## 2017-06-27 NOTE — ED Provider Notes (Signed)
Care assumed from previous provider PA Robsinson. Please see note for further details. Case discussed, plan agreed upon. Will follow up on pending MRI brain. If negative, likely discharge home with outpatient neurology follow up.   MRI reviewed showing old infarct, but no acute abnormalities.   Will have patient follow up with his neurologist - he already has upcoming appointment scheduled. MRI results, follow up care and return precautions were discussed with patient and family at bedside. Family aiding in translation. All questions answered.     Steve Callahan, Steve PicketJaime Pilcher, PA-C 06/27/17 1810    Little, Ambrose Finlandachel Morgan, MD 06/27/17 [phone]

## 2017-06-27 NOTE — ED Notes (Signed)
Per Nehemiah SettleBrooke (MRI) pt has 4 pt in front of him for imaging. Pt updated on wait time.

## 2017-06-27 NOTE — ED Notes (Signed)
Pt returned from MRI, family remains at the bedside

## 2017-06-27 NOTE — ED Notes (Signed)
Pt continues to be dizzy

## 2017-06-27 NOTE — ED Provider Notes (Signed)
[facility] EMERGENCY DEPARTMENT Provider Note   CSN: [id number] Arrival date & time: 06/27/17  1324     History   Chief Complaint Chief Complaint  Patient presents with  . Dizziness    HPI Steve Callahan is a 57 y.o. male with past medical history of CVA, presenting to the ED with intermittent dizziness since Friday.  Patient states he has had episodes of room spinning dizziness with associated blurry vision and tingling sensation in his bilateral upper arms, as well as a heaviness of his right leg.  His wife states she thinks she may have seen the left side of his face droop earlier in the weekend, however this has resolved.  Patient reports most recent episodes of dizziness began this morning at 6 AM and have been intermittent throughout the morning. States dizziness is not positional.  Denies headache, nausea, slurred speech. Takes aspirin daily and reports compliance with other daily medications.  Pt has neurology appointment this month for follow up.  The history is provided by the patient. Language interpreter used: Pt's daughter     Past Medical History:  Diagnosis Date  . High cholesterol   . Stroke [facility])     Patient Active Problem List   Diagnosis Date Noted  . Hyperlipemia 06/27/2016  . Stroke-like symptoms   . Elevated blood sugar   . Right sided weakness   . Language barrier affecting health care   . Cerebral infarction (HCC)   . 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 Stroke [facility]) 05/01/2016    History reviewed. No pertinent surgical history.      Home Medications    Prior to Admission medications   Medication Sig Start Date End Date Taking? Authorizing Provider  aspirin EC 81 MG EC tablet Take 1 tablet (81 mg total) by mouth daily. 05/04/16   Casey Burkitt, MD  atorvastatin (LIPITOR) 40 MG tablet Take 1 tablet (40 mg total) by mouth daily at 6 PM. 12/15/16   Nilda Riggs, NP  UNKNOWN TO PATIENT muscle relaxer     [provider]    Family History No family history on file.  Social History Social History   Tobacco Use  . Smoking status: Never Smoker  . Smokeless tobacco: Never Used  Substance Use Topics  . Alcohol use: No  . Drug use: No     Allergies   Patient has no known allergies.   Review of Systems Review of Systems  Eyes: Positive for visual disturbance.  Respiratory: Negative for shortness of breath.   Cardiovascular: Negative for chest pain and palpitations.  Neurological: Positive for dizziness and facial asymmetry. Negative for syncope, speech difficulty and headaches.  Hematological: Does not bruise/bleed easily.  Psychiatric/Behavioral: Negative for confusion.  All other systems reviewed and are negative.    Physical Exam Updated Vital Signs BP 113/74   Pulse (!) 54   Temp 99.7 F (37.6 C) (Oral)   Resp 11   SpO2 99%   Physical Exam  Constitutional: He is oriented to person, place, and time. He appears well-developed and well-nourished. No distress.  HENT:  Head: Normocephalic and atraumatic.  Mouth/Throat: Oropharynx is clear and moist.  Eyes: Pupils are equal, round, and reactive to light. Conjunctivae and EOM are normal.  Neck: Normal range of motion. Neck supple.  Cardiovascular: Regular rhythm, normal heart sounds and intact distal pulses.  Pulmonary/Chest: Effort normal and breath sounds normal. No respiratory distress.  Abdominal: Soft. Bowel sounds are normal. He exhibits no  distension. There is no tenderness. There is no guarding.  Neurological: He is alert and oriented to person, place, and time.  Mental Status:  Alert, oriented, thought content appropriate, able to give a coherent history. Speech fluent without evidence of aphasia. Able to follow 2 step commands without difficulty.  Cranial Nerves:  II:  Peripheral visual fields grossly normal, pupils equal, round, reactive to light III,IV, VI: ptosis not present, extra-ocular motions  intact bilaterally  V,VII: smile symmetric, facial light touch sensation equal VIII: hearing grossly normal to voice  X: uvula elevates symmetrically  XI: bilateral shoulder shrug symmetric and strong XII: midline tongue extension without fassiculations Motor:  Normal tone. 5/5 in upper and lower extremities bilaterally including strong and equal grip strength and dorsiflexion/plantar flexion Sensory: Pinprick and light touch normal in all extremities.  Deep Tendon Reflexes: 2+ and symmetric in the biceps and patella Cerebellar: normal finger-to-nose with bilateral upper extremities Gait: normal gait and balance CV: distal pulses palpable throughout    Skin: Skin is warm.  Psychiatric: He has a normal mood and affect. His behavior is normal.  Nursing note and vitals reviewed.    ED Treatments / Results  Labs (all labs ordered are listed, but only abnormal results are displayed) Labs Reviewed  BASIC METABOLIC PANEL - Abnormal; Notable for the following components:      Result Value   Glucose, Bld 101 (*)    All other components within normal limits  CBC  I-STAT TROPONIN, ED    EKG None  Radiology Ct Head Wo Contrast  Result Date: 06/27/2017 CLINICAL DATA:  Dizziness for the past several days. History of CVA. EXAM: CT HEAD WITHOUT CONTRAST TECHNIQUE: Contiguous axial images were obtained from the base of the skull through the vertex without intravenous contrast. COMPARISON:  Brain MR 05/02/2016.  CT 05/01/2016 FINDINGS: Brain: Left paramidline pontine remote infarct, including on image 7/3. No mass lesion, hemorrhage, hydrocephalus, acute infarct, intra-axial, or extra-axial fluid collection. Vascular: Intracranial atherosclerosis. Skull: Normal Sinuses/Orbits: Normal imaged portions of the orbits and globes. Clear paranasal sinuses and mastoid air cells. Other: None. IMPRESSION: 1.  No acute intracranial abnormality. 2. Remote pontine infarct. Electronically Signed   By: Jeronimo GreavesKyle   Talbot M.D.   On: 06/27/2017 08:44    Procedures Procedures (including critical care time)  Medications Ordered in ED Medications - No data to display   Initial Impression / Assessment and Plan / ED Course  I have reviewed the triage vital signs and the nursing notes.  Pertinent labs & imaging results that were available during my care of the patient were reviewed by me and considered in my medical decision making (see chart for details).     Pt with PMHx pontine infarct, presenting to ED with intermittent dizziness and paresthesias that began on Friday. Denies CP, SOB, abd complaints. On exam, no focal neuro deficits. Normal coordination and balance. BMP and CBC wnl. Trop neg. CT without acute changes. Given patient's hx of CVA and similarity of sx to previous stroke, MRI ordered.  Handoff to Ward, PA-C at shift change pending MRI. If neg, pt safe for discharge with follow up with his neurologist.   Pt discussed with Dr. Lynelle DoctorKnapp, who agrees with care plan. Final Clinical Impressions(s) / ED Diagnoses   Final diagnoses:  None    ED Discharge Orders    None       Adryen Cookson, [place] N, PA-C 06/27/17 1608    Linwood DibblesKnapp, Jon, MD 06/28/17 [phone]

## 2017-07-11 NOTE — Progress Notes (Signed)
[facility]  PATIENT: Steve Callahan DOB: 07/26/1960   REASON FOR VISIT: Follow-up for stroke left paramedian pontine infarct HISTORY FROM: Patient and daughter   And interpreter    HISTORY OF PRESENT ILLNESS: UPDATE 4/24/2019CM Mr. Earnest ConroyFlores, 57 year old male returns for follow-up with history of stroke event in February 2018.  He is currently on aspirin for secondary stroke prevention with minimal bruising and no bleeding.  He has not had further stroke or TIA symptoms since that time.  He was seen in the emergency room on 4/ 9/19 for episode of intermittent dizziness.  He had an episode of room spinning with associated blurry vision and heaviness of his right leg.  He denies any headache nausea blurred speech etc.  MRI of the brain with old infarct of the paramedian left pons otherwise normal aging brain.  He remains on Lipitor for hyperlipidemia.  He has not had any myalgias.  He reports that his labs are through his primary care however I do not have access to that information.  Blood pressure in the office today 108/64.  Patient was encouraged to stay well-hydrated.  [place] in [place]. He works full-time for [employer].  He returns for reevaluation  UPDATE 09/27/2018CM Mr.Earnest ConroyFlores, 57 year old Spanish speaking male returns for follow-up with history of stroke in February. H is currently on aspirin without further stroke or TIA symptoms. He has no bruising and no bleeding. He is supposed to be on Lipitor but ran out of the medication 1 month ago and did not get it renewed. His primary care [place]. Jenne [place]Bates [place].Patient assumed he could  stop taking the medication. He was made aware that he needs to stay on  this for his cholesterol He has return to work.Blood pressure in the office today 119/78. eturns for reevaluation    HISTORY: 06/27/16 CMMr. Earnest ConroyFlores, 57 year old male returns for follow-up with his wife and daughter. I had to call the language line to  communicate with patient and family. Family speaks Spanish. Patient was admitted to the hospital in February after a 2 day history of dizziness and slurred speech and numbness along the right side of the body he did have some slurring of his speech. No prior history of stroke. Patient was not administered IV TPA due to timeframes MRI of the brain showed left pontine infarct. 2-D echo 60-65%. No PFO. LDL 92 hemoglobin A1c 5.5. He returns to the clinic today for hospital follow-up. He is currently on aspirin for secondary stroke prevention without recurrent stroke or TIA symptoms. In addition he has no bruising or bleeding. He is also on Lipitor for hyperlipidemia. He is to have his lipids checked on April 19 at [facility] [place]. Dr. Royal HawthornBetts [facility]. Blood pressure in the office today 120/75 .He has been given a muscle relaxant he does not know the name of this He returns for reevaluation   REVIEW OF SYSTEMS: Full 14 system review of systems performed and notable only for those listed, all others are neg:  Constitutional: Fatigue Cardiovascular: neg Ear/Nose/Throat: neg  Skin: neg Eyes: Blurred vision Respiratory: neg Gastroitestinal: neg  Hematology/Lymphatic: neg  Endocrine: neg Musculoskeletal: back pain, shoulder pain  Allergy/Immunology: neg Neurological: Intermittent dizziness takes Antivert Psychiatric: anxiety Sleep : Insomnia   ALLERGIES: No Known Allergies  HOME MEDICATIONS: Outpatient Medications Prior to Visit  Medication Sig Dispense Refill  . aspirin EC 81 MG EC tablet Take 1 tablet (81 mg total) by  mouth daily. 30 tablet 1  . atorvastatin (LIPITOR) 40 MG tablet Take 1 tablet (40 mg total) by mouth daily at 6 PM. 30 tablet 3  . meclizine (ANTIVERT) 25 MG tablet Take 1 tablet (25 mg total) by mouth 3 (three) times daily as needed for dizziness. 15 tablet 0  . UNKNOWN TO PATIENT muscle relaxer     No facility-administered medications  prior to visit.     PAST MEDICAL HISTORY: Past Medical History:  Diagnosis Date  . High cholesterol   . Stroke [facility])     PAST SURGICAL HISTORY: History reviewed. No pertinent surgical history.  FAMILY HISTORY: History reviewed. No pertinent family history.  SOCIAL HISTORY: Social History   Socioeconomic History  . Marital status: Married    Spouse name: Byrd Hesselbach  . Number of children: 5  . Years of education: 8  . Highest education level: Not on file  Occupational History    Comment: not working at [employer] time 06/27/16  Social Needs  . Financial resource strain: Not on file  . Food insecurity:    Worry: Not on file    Inability: Not on file  . Transportation needs:    Medical: Not on file    Non-medical: Not on file  Tobacco Use  . Smoking status: Never Smoker  . Smokeless tobacco: Never Used  Substance and Sexual Activity  . Alcohol use: No  . Drug use: No  . Sexual activity: Not on file  Lifestyle  . Physical activity:    Days per week: Not on file    Minutes per session: Not on file  . Stress: Not on file  Relationships  . Social connections:    Talks on phone: Not on file    Gets together: Not on file    Attends religious service: Not on file    Active member of club or organization: Not on file    Attends meetings of clubs or organizations: Not on file    Relationship status: Not on file  . Intimate partner violence:    Fear of current or ex partner: Not on file    Emotionally abused: Not on file    Physically abused: Not on file    Forced sexual activity: Not on file  Other Topics Concern  . Not on file  Social History Narrative  . Not on file     PHYSICAL EXAM  Vitals:   07/12/17 0808  BP: 108/64  Pulse: 62  Weight: 168 lb (76.2 kg)  Height: 5\' 4"  (1.626 m)   Body mass index is 28.84 kg/m.  Generalized: Well developed, in no acute distress  Head: normocephalic and atraumatic,. Oropharynx benign cerumen right ear Neck: Supple, no carotid  bruits  Cardiac: Regular rate rhythm, no murmur  Musculoskeletal: No deformity   Neurological examination   Mentation: Alert oriented to time, place, history taking. Attention span and concentration appropriate. Recent and remote memory intact.  Follows all commands Through language interpreter   Cranial nerve II-XII: Pupils were equal round reactive to light extraocular movements were full, visual field were full on confrontational test. Facial sensation and strength were normal. hearing was intact to finger rubbing bilaterally. Uvula tongue midline. head turning and shoulder shrug were normal and symmetric.Tongue protrusion into cheek strength was normal. Motor: normal bulk and tone, full strength in the BUE, BLE, fine finger movements normal, no pronator drift. No focal weakness Sensory: normal and symmetric to light touch, pinprick and vibratory in the upper and lower  extremities  Coordination: finger-nose-finger, heel-to-shin bilaterally, no dysmetria Reflexes: 1+ upper lower and symmetric, plantar responses were flexor bilaterally. Gait and Station: Rising up from seated position without assistance, normal stance,  moderate stride, good arm swing, smooth turning, able to perform tiptoe, and heel walking without difficulty. Tandem gait is steady  DIAGNOSTIC DATA (LABS, IMAGING, TESTING) - I reviewed patient records, labs, notes, testing and imaging myself where available.  Lab Results  Component Value Date   WBC 5.5 06/27/2017   HGB 15.1 06/27/2017   HCT 45.3 06/27/2017   MCV 86.1 06/27/2017   PLT 280 06/27/2017      Component Value Date/Time   NA 140 06/27/2017 0753   K 3.9 06/27/2017 0753   CL 110 06/27/2017 0753   CO2 22 06/27/2017 0753   GLUCOSE 101 (H) 06/27/2017 0753   BUN 8 06/27/2017 0753   CREATININE 0.84 06/27/2017 0753   CALCIUM 9.6 06/27/2017 0753   PROT 6.5 05/01/2016 1728   ALBUMIN 3.9 05/01/2016 1728   AST 23 05/01/2016 1728   ALT 19 05/01/2016 1728    ALKPHOS 75 05/01/2016 1728   BILITOT 0.4 05/01/2016 1728   GFRNONAA >60 06/27/2017 0753   GFRAA >60 06/27/2017 0753   Lab Results  Component Value Date   CHOL 161 05/02/2016   HDL 37 (L) 05/02/2016   LDLCALC 92 05/02/2016   TRIG 159 (H) 05/02/2016   CHOLHDL 4.4 05/02/2016   Lab Results  Component Value Date   HGBA1C 5.5 05/02/2016    Lab Results  Component Value Date   TSH 2.363 05/02/2016      ASSESSMENT AND PLAN  57 y.o. year old male  has a past medical history of High cholesterol and Stroke (HCC). 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 Patient has a risk factor of hyperlipidemia. Patient does not have a history of hypertension diabetes smoking or obstructive sleep apnea.  Patient was seen in the emergency room on 06/27/2017 for intermittent dizziness.  MRI of the brain showed old infarct of the paramedian left pons otherwise normal aging brain.  He was given some Antivert.  The patient is a current patient of Dr. Roda Shutters  who is out of the office today . This note is sent to the work in doctor.       Stressed the importance of management of risk factors to prevent further stroke Continue aspirin for secondary stroke prevention Maintain strict control of hypertension with blood pressure goal below 130/90, Control of diabetes with hemoglobin A1c below 6.5 followed by primary care Cholesterol with LDL cholesterol less than 70, followed by primary care, continue Lipitor  exercise by walking, 30 min daily eat healthy diet with whole grains,  fresh fruits and vegetables Follow-up with primary care for stroke risk factor modification, maintain blood pressure goal less than 130 systolic, diabetes with A1c below 7, lipids with LDL below 70 No further stroke or TIA symptoms since February 2018 Stay well hydrated Will discharge from stroke clinic  I spent 25 minutes in total face to face time with the  patient and family through the interpreter more than 50% of which was spent counseling and coordination of care, reviewing test results reviewing medications and discussing and reviewing the diagnosis of stroke and management of risk factors and importance of following up with primary care provider. Nilda Riggs, [facility], [facility], APRN  [facility] [address] [place], [place] [address] [phone]  [phone] 

## 2017-07-12 ENCOUNTER — Encounter: Payer: Self-pay | Admitting: Nurse Practitioner

## 2017-07-12 ENCOUNTER — Ambulatory Visit: Payer: Self-pay | Admitting: Nurse Practitioner

## 2017-07-12 VITALS — BP 108/64 | HR 62 | Ht 64.0 in | Wt 168.0 lb

## 2017-07-12 DIAGNOSIS — E785 Hyperlipidemia, unspecified: Secondary | ICD-10-CM

## 2017-07-12 DIAGNOSIS — Z8673 Personal history of transient ischemic attack (TIA), and cerebral infarction without residual deficits: Secondary | ICD-10-CM | POA: Insufficient documentation

## 2017-07-12 NOTE — Patient Instructions (Signed)
Stressed the importance of management of risk factors to prevent further stroke Continue aspirin for secondary stroke prevention Maintain strict control of hypertension with blood pressure goal below 130/90, Control of diabetes with hemoglobin A1c below 6.5 followed by primary care Cholesterol with LDL cholesterol less than 70, followed by primary care, continue Lipitor exercise by walking, 30 min daily eat healthy diet with whole grains,  fresh fruits and vegetables Follow-up with primary care for stroke risk factor modification, maintain blood pressure goal less than 130 systolic, diabetes with A1c below 7, lipids with LDL below 70 No further stroke or TIA symptoms since February 2018 Stay well hydrated Will discharge from stroke clinic

## 2017-07-14 NOTE — Progress Notes (Signed)
I agree with the above plan 

## 2019-03-21 ENCOUNTER — Encounter (HOSPITAL_COMMUNITY): Payer: Self-pay | Admitting: Emergency Medicine

## 2019-03-21 ENCOUNTER — Emergency Department (HOSPITAL_COMMUNITY)
Admission: EM | Admit: 2019-03-21 | Discharge: 2019-03-21 | Disposition: A | Discharge status: Home / Self Care | Payer: No Typology Code available for payment source | Attending: Emergency Medicine | Admitting: Emergency Medicine

## 2019-03-21 ENCOUNTER — Other Ambulatory Visit: Payer: Self-pay

## 2019-03-21 DIAGNOSIS — Z79899 Other long term (current) drug therapy: Secondary | ICD-10-CM | POA: Insufficient documentation

## 2019-03-21 DIAGNOSIS — Z7982 Long term (current) use of aspirin: Secondary | ICD-10-CM | POA: Insufficient documentation

## 2019-03-21 DIAGNOSIS — Y939 Activity, unspecified: Secondary | ICD-10-CM | POA: Insufficient documentation

## 2019-03-21 DIAGNOSIS — Z23 Encounter for immunization: Secondary | ICD-10-CM | POA: Insufficient documentation

## 2019-03-21 DIAGNOSIS — S61212A Laceration without foreign body of right middle finger without damage to nail, initial encounter: Secondary | ICD-10-CM | POA: Diagnosis present

## 2019-03-21 DIAGNOSIS — Y9259 Other trade areas as the place of occurrence of the external cause: Secondary | ICD-10-CM | POA: Insufficient documentation

## 2019-03-21 DIAGNOSIS — W2209XA Striking against other stationary object, initial encounter: Secondary | ICD-10-CM | POA: Diagnosis not present

## 2019-03-21 DIAGNOSIS — Y990 Civilian activity done for income or pay: Secondary | ICD-10-CM | POA: Insufficient documentation

## 2019-03-21 MED ORDER — TETANUS-DIPHTH-ACELL PERTUSSIS 5-2.5-18.5 LF-MCG/0.5 IM SUSP
0.5000 mL | Freq: Once | INTRAMUSCULAR | Status: AC
  Administered 2019-03-21: 08:00:00 0.5 mL via INTRAMUSCULAR
  Filled 2019-03-21: qty 0.5

## 2019-03-21 NOTE — ED Triage Notes (Signed)
Pt. Stated, I was cutting and hit it .

## 2019-03-21 NOTE — ED Provider Notes (Signed)
[facility] EMERGENCY DEPARTMENT Provider Note   CSN: [id number] Arrival date & time: 03/21/19  8315     History Chief Complaint  Patient presents with  . Finger Injury    Steve Callahan is a 58 y.o. male.  Patient is a 58 year old male with a history of hypertension and stroke presenting today with a laceration to his right middle finger.  Patient was at work cleaning out a drum that had residue built up in the bottom and states he was using a shovel to do this and scraped his finger along the side of the glass.  He was wearing gloves but it cut his finger anyway.  He is having some numbness to the tip of his right middle finger but denies any weakness.  No foreign bodies.  Tetanus shot is unknown.  Pain is a sharp sensation that has improved with time.  It is made worse with bending.  Pain does not radiate.  He has sustained no other injuries.  Bleeding is controlled.  He does take aspirin but no other anticoagulants.  The history is provided by the patient. The history is limited by a language barrier. A language interpreter was used.       Past Medical History:  Diagnosis Date  . High cholesterol   . Stroke [facility])     Patient Active Problem List   Diagnosis Date Noted  . History of stroke 07/12/2017  . Hyperlipemia 06/27/2016  . Stroke-like symptoms   . Elevated blood sugar   . Right sided weakness   . Language barrier affecting health care   . Cerebral infarction (HCC)   . 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 Stroke [facility]) 05/01/2016    History reviewed. No pertinent surgical history.     No family history on file.  Social History   Tobacco Use  . Smoking status: Never Smoker  . Smokeless tobacco: Never Used  Substance Use Topics  . Alcohol use: No  . Drug use: No    Home Medications Prior to Admission medications   Medication Sig Start Date End Date Taking? Authorizing Provider  aspirin EC 81 MG EC tablet Take 1 tablet (81 mg total) by  mouth daily. 05/04/16   Casey Burkitt, MD  atorvastatin (LIPITOR) 40 MG tablet Take 1 tablet (40 mg total) by mouth daily at 6 PM. 12/15/16   Nilda Riggs, NP  meclizine (ANTIVERT) 25 MG tablet Take 1 tablet (25 mg total) by mouth 3 (three) times daily as needed for dizziness. 06/27/17   Ward, Chase Picket, PA-C    Allergies    Patient has no known allergies.  Review of Systems   Review of Systems  All other systems reviewed and are negative.   Physical Exam Updated Vital Signs BP 119/80 (BP Location: Left Arm)   Pulse 66   Temp 98.3 F (36.8 C) (Oral)   Resp 20   SpO2 100%   Physical Exam Vitals and nursing note reviewed.  Constitutional:      General: He is not in acute distress.    Appearance: Normal appearance. He is normal weight.  HENT:     Head: Normocephalic.  Cardiovascular:     Rate and Rhythm: Normal rate.     Pulses: Normal pulses.  Pulmonary:     Effort: Pulmonary effort is normal.  Musculoskeletal:        General: Signs of injury present.     Right hand: Laceration present.  Hands:  Skin:    General: Skin is warm.     Capillary Refill: Capillary refill takes less than 2 seconds.  Neurological:     Mental Status: He is alert.  Psychiatric:        Mood and Affect: Mood normal.        Behavior: Behavior normal.     ED Results / Procedures / Treatments   Labs (all labs ordered are listed, but only abnormal results are displayed) Labs Reviewed - No data to display  EKG None  Radiology No results found.  Procedures Procedures (including critical care time) LACERATION REPAIR Performed by: [employer] Authorized by: Blanchie Dessert Consent: Verbal consent obtained. Risks and benefits: risks, benefits and alternatives were discussed Consent given by: patient Patient identity confirmed: provided demographic data Prepped and Draped in normal sterile fashion Wound explored  Laceration Location: right middle  finger  Laceration Length: 5cm  No Foreign Bodies seen or palpated  Anesthesia: local infiltration  Local anesthetic: lidocaine 2% with epinephrine  Anesthetic total: 2 ml  Irrigation method: bulb syringe Amount of cleaning: standard  Skin closure: 4.0 vicryl and 4.0 vicryl rapide  Number of sutures: 12 vicryl rapide and 2 4.0 vicryl    Technique: simple interrupted and deep buried suture  Patient tolerance: Patient tolerated the procedure well with no immediate complications.   Medications Ordered in ED Medications  Tdap (BOOSTRIX) injection 0.5 mL (has no administration in time range)    ED Course  I have reviewed the triage vital signs and the nursing notes.  Pertinent labs & imaging results that were available during my care of the patient were reviewed by me and considered in my medical decision making (see chart for details).    MDM Rules/Calculators/A&P                      Patient presenting with a laceration to the right middle finger from a work accident.  His tendon is visualized but completely intact and he has full strength of the right middle finger with full range of motion of the DIP PIP and MCP joint.  Tetanus shot was updated.  Wound repaired as above.  Patient was placed in a finger splint to avoid bending over the next few days to a week to help with healing.  Patient was given wound care instructions and return precautions.  Final Clinical Impression(s) / ED Diagnoses Final diagnoses:  Laceration of right middle finger without foreign body without damage to nail, initial encounter    Rx / DC Orders ED Discharge Orders    None       Blanchie Dessert, MD 03/21/19 [phone]

## 2019-03-21 NOTE — ED Triage Notes (Signed)
Laceration to rt. Middle finger.

## 2019-03-21 NOTE — ED Notes (Signed)
Patient verbalizes understanding of discharge instructions. Opportunity for questioning and answers were provided. Armband removed by staff, pt discharged from ED.
